# Patient Record
Sex: Male | Born: 1950 | Race: White | Hispanic: No | Marital: Married | Smoking: Current every day smoker
Health system: Southern US, Community
[De-identification: ages and names within clinical notes are randomized; demographics above are authoritative.]

## PROBLEM LIST (undated history)

## (undated) DIAGNOSIS — E785 Hyperlipidemia, unspecified: Secondary | ICD-10-CM

## (undated) DIAGNOSIS — I4891 Unspecified atrial fibrillation: Secondary | ICD-10-CM

## (undated) DIAGNOSIS — F0280 Dementia in other diseases classified elsewhere without behavioral disturbance: Secondary | ICD-10-CM

## (undated) DIAGNOSIS — G4733 Obstructive sleep apnea (adult) (pediatric): Secondary | ICD-10-CM

## (undated) DIAGNOSIS — Z7901 Long term (current) use of anticoagulants: Secondary | ICD-10-CM

## (undated) DIAGNOSIS — R000 Tachycardia, unspecified: Secondary | ICD-10-CM

## (undated) HISTORY — PX: UMBILICAL HERNIA REPAIR: SHX196

## (undated) HISTORY — DX: Obstructive sleep apnea (adult) (pediatric): G47.33

## (undated) HISTORY — PX: ORBITAL FRACTURE SURGERY: SHX725

---

## 2003-04-10 ENCOUNTER — Ambulatory Visit (HOSPITAL_COMMUNITY): Admission: RE | Admit: 2003-04-10 | Discharge: 2003-04-10 | Payer: Self-pay | Admitting: Cardiovascular Disease

## 2006-06-06 ENCOUNTER — Ambulatory Visit (HOSPITAL_COMMUNITY): Admission: RE | Admit: 2006-06-06 | Discharge: 2006-06-06 | Payer: Self-pay | Admitting: Gastroenterology

## 2009-01-22 ENCOUNTER — Encounter: Admission: RE | Admit: 2009-01-22 | Discharge: 2009-01-22 | Payer: Self-pay | Admitting: General Surgery

## 2009-01-23 ENCOUNTER — Ambulatory Visit (HOSPITAL_BASED_OUTPATIENT_CLINIC_OR_DEPARTMENT_OTHER): Admission: RE | Admit: 2009-01-23 | Discharge: 2009-01-23 | Payer: Self-pay | Admitting: General Surgery

## 2010-03-13 ENCOUNTER — Encounter: Payer: Self-pay | Admitting: Cardiovascular Disease

## 2010-05-25 LAB — CBC
HCT: 43 % (ref 39.0–52.0)
Platelets: 214 10*3/uL (ref 150–400)
RDW: 14.9 % (ref 11.5–15.5)
WBC: 4.3 10*3/uL (ref 4.0–10.5)

## 2010-05-25 LAB — DIFFERENTIAL
Basophils Absolute: 0 10*3/uL (ref 0.0–0.1)
Eosinophils Relative: 2 % (ref 0–5)
Lymphocytes Relative: 25 % (ref 12–46)
Neutro Abs: 2.7 10*3/uL (ref 1.7–7.7)
Neutrophils Relative %: 64 % (ref 43–77)

## 2010-05-25 LAB — BASIC METABOLIC PANEL
BUN: 14 mg/dL (ref 6–23)
Calcium: 9.3 mg/dL (ref 8.4–10.5)
Creatinine, Ser: 1.02 mg/dL (ref 0.4–1.5)
GFR calc non Af Amer: >60 mL/min (ref >60)
Glucose, Bld: 114 mg/dL — ABNORMAL HIGH (ref 70–99)

## 2010-07-09 NOTE — Op Note (Signed)
NAME:  Bradley Mcbride, Bradley Mcbride             ACCOUNT NO.:  [id number]   MEDICAL RECORD NO.:  [id number]          PATIENT TYPE:  AMB   LOCATION:  ENDO                         FACILITY:  [facility]   PHYSICIAN:  Shirley Friar, MDDATE OF BIRTH:  1950-12-04   DATE OF PROCEDURE:  06/06/2006  DATE OF DISCHARGE:                               OPERATIVE REPORT   PROCEDURE:  Colonoscopy.   INDICATION:  Heme-positive stool.   MEDICATIONS:  Fentanyl 100 mcg IV, Versed 10 mg IV.   FINDINGS:  Rectal exam was normal.  The adult Pentax colonoscope was  inserted into a fair prepped colon and advanced to the cecum where the  ileocecal valve and appendiceal orifice were identified.  Careful  withdrawal of the colonoscope revealed no polyps or lesions.  He had  diffuse sigmoid diverticulosis, both large and small size.  Retroflexion  showed small internal hemorrhoids.   ASSESSMENT:  1. Sigmoid diverticulosis.  2. Small internal hemorrhoids.   PLAN:  Check CBC, if the patient is anemic, then may need to do upper  endoscopy.      Shirley Friar, MD  Electronically Signed     VCS/MEDQ  D:  06/06/2006  T:  06/06/2006  Job:  [id number]   cc:   Bryan Lemma. Manus Gunning, M.D.

## 2015-08-07 DIAGNOSIS — H524 Presbyopia: Secondary | ICD-10-CM | POA: Diagnosis not present

## 2015-08-07 DIAGNOSIS — H5213 Myopia, bilateral: Secondary | ICD-10-CM | POA: Diagnosis not present

## 2015-08-07 DIAGNOSIS — H40012 Open angle with borderline findings, low risk, left eye: Secondary | ICD-10-CM | POA: Diagnosis not present

## 2015-08-07 DIAGNOSIS — H40011 Open angle with borderline findings, low risk, right eye: Secondary | ICD-10-CM | POA: Diagnosis not present

## 2015-08-10 DIAGNOSIS — L989 Disorder of the skin and subcutaneous tissue, unspecified: Secondary | ICD-10-CM | POA: Diagnosis not present

## 2015-08-10 DIAGNOSIS — C44329 Squamous cell carcinoma of skin of other parts of face: Secondary | ICD-10-CM | POA: Diagnosis not present

## 2015-08-11 DIAGNOSIS — I10 Essential (primary) hypertension: Secondary | ICD-10-CM | POA: Diagnosis not present

## 2015-08-11 DIAGNOSIS — Z7982 Long term (current) use of aspirin: Secondary | ICD-10-CM | POA: Diagnosis not present

## 2015-08-11 DIAGNOSIS — Z87891 Personal history of nicotine dependence: Secondary | ICD-10-CM | POA: Diagnosis not present

## 2015-08-11 DIAGNOSIS — C4492 Squamous cell carcinoma of skin, unspecified: Secondary | ICD-10-CM

## 2015-08-11 DIAGNOSIS — Z79899 Other long term (current) drug therapy: Secondary | ICD-10-CM | POA: Diagnosis not present

## 2015-08-11 DIAGNOSIS — C44329 Squamous cell carcinoma of skin of other parts of face: Secondary | ICD-10-CM | POA: Diagnosis not present

## 2015-08-11 HISTORY — DX: Squamous cell carcinoma of skin, unspecified: C44.92

## 2015-09-07 DIAGNOSIS — C44329 Squamous cell carcinoma of skin of other parts of face: Secondary | ICD-10-CM | POA: Diagnosis not present

## 2015-09-08 DIAGNOSIS — Z85828 Personal history of other malignant neoplasm of skin: Secondary | ICD-10-CM | POA: Diagnosis not present

## 2015-09-08 DIAGNOSIS — L905 Scar conditions and fibrosis of skin: Secondary | ICD-10-CM | POA: Diagnosis not present

## 2015-10-16 DIAGNOSIS — L821 Other seborrheic keratosis: Secondary | ICD-10-CM | POA: Diagnosis not present

## 2015-10-16 DIAGNOSIS — Z85828 Personal history of other malignant neoplasm of skin: Secondary | ICD-10-CM | POA: Diagnosis not present

## 2015-10-16 DIAGNOSIS — L309 Dermatitis, unspecified: Secondary | ICD-10-CM | POA: Diagnosis not present

## 2015-10-16 DIAGNOSIS — L570 Actinic keratosis: Secondary | ICD-10-CM | POA: Diagnosis not present

## 2015-10-16 DIAGNOSIS — D225 Melanocytic nevi of trunk: Secondary | ICD-10-CM | POA: Diagnosis not present

## 2015-10-16 DIAGNOSIS — D485 Neoplasm of uncertain behavior of skin: Secondary | ICD-10-CM | POA: Diagnosis not present

## 2015-10-16 DIAGNOSIS — B351 Tinea unguium: Secondary | ICD-10-CM | POA: Diagnosis not present

## 2015-10-19 DIAGNOSIS — L309 Dermatitis, unspecified: Secondary | ICD-10-CM | POA: Diagnosis not present

## 2015-10-19 DIAGNOSIS — L719 Rosacea, unspecified: Secondary | ICD-10-CM | POA: Diagnosis not present

## 2015-10-19 DIAGNOSIS — L280 Lichen simplex chronicus: Secondary | ICD-10-CM | POA: Diagnosis not present

## 2015-10-28 DIAGNOSIS — Z125 Encounter for screening for malignant neoplasm of prostate: Secondary | ICD-10-CM | POA: Diagnosis not present

## 2015-10-28 DIAGNOSIS — Z0000 Encounter for general adult medical examination without abnormal findings: Secondary | ICD-10-CM | POA: Diagnosis not present

## 2015-10-28 DIAGNOSIS — N529 Male erectile dysfunction, unspecified: Secondary | ICD-10-CM | POA: Diagnosis not present

## 2015-10-28 DIAGNOSIS — E7800 Pure hypercholesterolemia, unspecified: Secondary | ICD-10-CM | POA: Diagnosis not present

## 2015-10-28 DIAGNOSIS — I10 Essential (primary) hypertension: Secondary | ICD-10-CM | POA: Diagnosis not present

## 2015-10-28 DIAGNOSIS — Z23 Encounter for immunization: Secondary | ICD-10-CM | POA: Diagnosis not present

## 2015-10-28 DIAGNOSIS — Z1211 Encounter for screening for malignant neoplasm of colon: Secondary | ICD-10-CM | POA: Diagnosis not present

## 2016-04-05 DIAGNOSIS — H5213 Myopia, bilateral: Secondary | ICD-10-CM | POA: Diagnosis not present

## 2016-12-14 DIAGNOSIS — Z23 Encounter for immunization: Secondary | ICD-10-CM | POA: Diagnosis not present

## 2016-12-14 DIAGNOSIS — N529 Male erectile dysfunction, unspecified: Secondary | ICD-10-CM | POA: Diagnosis not present

## 2016-12-14 DIAGNOSIS — I10 Essential (primary) hypertension: Secondary | ICD-10-CM | POA: Diagnosis not present

## 2016-12-14 DIAGNOSIS — Z1211 Encounter for screening for malignant neoplasm of colon: Secondary | ICD-10-CM | POA: Diagnosis not present

## 2016-12-14 DIAGNOSIS — Z0000 Encounter for general adult medical examination without abnormal findings: Secondary | ICD-10-CM | POA: Diagnosis not present

## 2016-12-14 DIAGNOSIS — E7800 Pure hypercholesterolemia, unspecified: Secondary | ICD-10-CM | POA: Diagnosis not present

## 2016-12-14 DIAGNOSIS — Z125 Encounter for screening for malignant neoplasm of prostate: Secondary | ICD-10-CM | POA: Diagnosis not present

## 2017-04-05 DIAGNOSIS — H5213 Myopia, bilateral: Secondary | ICD-10-CM | POA: Diagnosis not present

## 2017-08-04 DIAGNOSIS — Z1211 Encounter for screening for malignant neoplasm of colon: Secondary | ICD-10-CM | POA: Diagnosis not present

## 2017-10-05 ENCOUNTER — Encounter (HOSPITAL_COMMUNITY): Payer: Self-pay | Admitting: Emergency Medicine

## 2017-10-05 ENCOUNTER — Emergency Department (HOSPITAL_COMMUNITY): Payer: 59

## 2017-10-05 ENCOUNTER — Inpatient Hospital Stay (HOSPITAL_COMMUNITY)
Admission: EM | Admit: 2017-10-05 | Discharge: 2017-10-06 | DRG: 310 | Disposition: A | Discharge status: Home / Self Care | Payer: 59 | Attending: Internal Medicine | Admitting: Internal Medicine

## 2017-10-05 DIAGNOSIS — I361 Nonrheumatic tricuspid (valve) insufficiency: Secondary | ICD-10-CM | POA: Diagnosis not present

## 2017-10-05 DIAGNOSIS — R5383 Other fatigue: Secondary | ICD-10-CM | POA: Diagnosis not present

## 2017-10-05 DIAGNOSIS — R079 Chest pain, unspecified: Secondary | ICD-10-CM | POA: Diagnosis not present

## 2017-10-05 DIAGNOSIS — Q211 Atrial septal defect: Secondary | ICD-10-CM | POA: Diagnosis not present

## 2017-10-05 DIAGNOSIS — R0602 Shortness of breath: Secondary | ICD-10-CM | POA: Diagnosis not present

## 2017-10-05 DIAGNOSIS — I484 Atypical atrial flutter: Principal | ICD-10-CM | POA: Diagnosis present

## 2017-10-05 DIAGNOSIS — I4902 Ventricular flutter: Secondary | ICD-10-CM | POA: Diagnosis not present

## 2017-10-05 DIAGNOSIS — I10 Essential (primary) hypertension: Secondary | ICD-10-CM | POA: Diagnosis present

## 2017-10-05 DIAGNOSIS — I4892 Unspecified atrial flutter: Secondary | ICD-10-CM | POA: Diagnosis not present

## 2017-10-05 DIAGNOSIS — I700 Atherosclerosis of aorta: Secondary | ICD-10-CM | POA: Diagnosis not present

## 2017-10-05 DIAGNOSIS — F17210 Nicotine dependence, cigarettes, uncomplicated: Secondary | ICD-10-CM | POA: Diagnosis present

## 2017-10-05 DIAGNOSIS — E785 Hyperlipidemia, unspecified: Secondary | ICD-10-CM | POA: Diagnosis present

## 2017-10-05 DIAGNOSIS — I4891 Unspecified atrial fibrillation: Secondary | ICD-10-CM | POA: Diagnosis present

## 2017-10-05 DIAGNOSIS — R0789 Other chest pain: Secondary | ICD-10-CM | POA: Diagnosis not present

## 2017-10-05 HISTORY — DX: Tachycardia, unspecified: R00.0

## 2017-10-05 LAB — TSH: TSH: 2.269 u[IU]/mL (ref 0.350–4.500)

## 2017-10-05 LAB — I-STAT TROPONIN, ED
Troponin i, poc: 0.01 ng/mL (ref 0.00–0.08)
Troponin i, poc: 0.01 ng/mL (ref 0.00–0.08)

## 2017-10-05 LAB — BASIC METABOLIC PANEL
Anion gap: 10 (ref 5–15)
BUN: 17 mg/dL (ref 8–23)
CHLORIDE: 107 mmol/L (ref 98–111)
CO2: 23 mmol/L (ref 22–32)
Calcium: 10 mg/dL (ref 8.9–10.3)
Creatinine, Ser: 1.33 mg/dL — ABNORMAL HIGH (ref 0.61–1.24)
GFR calc Af Amer: >60 mL/min (ref >60)
GFR calc non Af Amer: 54 mL/min — ABNORMAL LOW (ref >60)
GLUCOSE: 127 mg/dL — AB (ref 70–99)
POTASSIUM: 4.7 mmol/L (ref 3.5–5.1)
Sodium: 140 mmol/L (ref 135–145)

## 2017-10-05 LAB — CBC
HEMATOCRIT: 47 % (ref 39.0–52.0)
Hemoglobin: 15.4 g/dL (ref 13.0–17.0)
MCH: 32.1 pg (ref 26.0–34.0)
MCHC: 32.8 g/dL (ref 30.0–36.0)
MCV: 97.9 fL (ref 78.0–100.0)
Platelets: 263 10*3/uL (ref 150–400)
RBC: 4.8 MIL/uL (ref 4.22–5.81)
RDW: 12.5 % (ref 11.5–15.5)
WBC: 9.6 10*3/uL (ref 4.0–10.5)

## 2017-10-05 LAB — MAGNESIUM: Magnesium: 2 mg/dL (ref 1.7–2.4)

## 2017-10-05 MED ORDER — SODIUM CHLORIDE 0.9 % IV SOLN: 250.0000 mL | INTRAVENOUS | Status: DC

## 2017-10-05 MED ORDER — ONDANSETRON HCL 4 MG/2ML IJ SOLN: 4.0000 mg | Freq: Four times a day (QID) | INTRAMUSCULAR | Status: DC | PRN

## 2017-10-05 MED ORDER — ASPIRIN EC 81 MG PO TBEC: 81.0000 mg | DELAYED_RELEASE_TABLET | Freq: Every day | ORAL | Status: DC

## 2017-10-05 MED ORDER — HYDROCORTISONE 1 % EX CREA
1.0000 "application " | TOPICAL_CREAM | Freq: Three times a day (TID) | CUTANEOUS | Status: DC | PRN
  Filled 2017-10-05: qty 28

## 2017-10-05 MED ORDER — SODIUM CHLORIDE 0.9% FLUSH
3.0000 mL | Freq: Two times a day (BID) | INTRAVENOUS | Status: DC
  Administered 2017-10-05 – 2017-10-06 (×3): 3 mL via INTRAVENOUS

## 2017-10-05 MED ORDER — SODIUM CHLORIDE 0.9 % IV SOLN: INTRAVENOUS | Status: DC

## 2017-10-05 MED ORDER — ACETAMINOPHEN 325 MG PO TABS: 650.0000 mg | ORAL_TABLET | ORAL | Status: DC | PRN

## 2017-10-05 MED ORDER — SODIUM CHLORIDE 0.9% FLUSH: 3.0000 mL | INTRAVENOUS | Status: DC | PRN

## 2017-10-05 MED ORDER — NITROGLYCERIN 0.4 MG SL SUBL: 0.4000 mg | SUBLINGUAL_TABLET | SUBLINGUAL | Status: DC | PRN

## 2017-10-05 MED ORDER — SODIUM CHLORIDE 0.9 % IV BOLUS
1000.0000 mL | Freq: Once | INTRAVENOUS | Status: AC
  Administered 2017-10-05: 1000 mL via INTRAVENOUS

## 2017-10-05 MED ORDER — SODIUM CHLORIDE 0.9 % IV SOLN
INTRAVENOUS | Status: DC
  Administered 2017-10-05: 11:00:00 via INTRAVENOUS

## 2017-10-05 MED ORDER — METOPROLOL SUCCINATE ER 100 MG PO TB24
200.0000 mg | ORAL_TABLET | Freq: Every day | ORAL | Status: DC
  Administered 2017-10-05: 200 mg via ORAL
  Filled 2017-10-05 (×2): qty 1

## 2017-10-05 MED ORDER — ADENOSINE 6 MG/2ML IV SOLN
6.0000 mg | Freq: Once | INTRAVENOUS | Status: AC
  Administered 2017-10-05: 6 mg via INTRAVENOUS
  Filled 2017-10-05: qty 2

## 2017-10-05 MED ORDER — ADENOSINE 6 MG/2ML IV SOLN
12.0000 mg | Freq: Once | INTRAVENOUS | Status: AC
  Administered 2017-10-05: 12 mg via INTRAVENOUS
  Filled 2017-10-05: qty 4

## 2017-10-05 MED ORDER — DIPHENHYDRAMINE HCL 25 MG PO CAPS: 12.5000 mg | ORAL_CAPSULE | Freq: Every evening | ORAL | Status: DC | PRN

## 2017-10-05 MED ORDER — APIXABAN 5 MG PO TABS
5.0000 mg | ORAL_TABLET | Freq: Two times a day (BID) | ORAL | Status: DC
  Administered 2017-10-05 – 2017-10-06 (×3): 5 mg via ORAL
  Filled 2017-10-05 (×3): qty 1

## 2017-10-05 MED ORDER — DIPHENHYDRAMINE HCL 12.5 MG/5ML PO ELIX
12.5000 mg | ORAL_SOLUTION | Freq: Every evening | ORAL | Status: DC | PRN
  Administered 2017-10-05: 12.5 mg via ORAL
  Filled 2017-10-05: qty 10
  Filled 2017-10-05 (×2): qty 5

## 2017-10-05 NOTE — Anesthesia Preprocedure Evaluation (Addendum)
Anesthesia Evaluation  Patient identified by MRN, date of birth, ID band Patient awake    Reviewed: Allergy & Precautions, NPO status , Patient's Chart, lab work & pertinent test results, reviewed documented beta blocker date and time   History of Anesthesia Complications Negative for: history of anesthetic complications  Airway Mallampati: II  TM Distance: >3 FB Neck ROM: Full    Dental no notable dental hx. (+) Dental Advisory Given, Teeth Intact   Pulmonary Current Smoker,    Pulmonary exam normal        Cardiovascular Normal cardiovascular exam+ dysrhythmias Atrial Fibrillation      Neuro/Psych negative neurological ROS     GI/Hepatic negative GI ROS, Neg liver ROS,   Endo/Other  negative endocrine ROS  Renal/GU Renal InsufficiencyRenal disease     Musculoskeletal negative musculoskeletal ROS (+)   Abdominal   Peds  Hematology negative hematology ROS (+)   Anesthesia Other Findings Day of surgery medications reviewed with the patient.  Reproductive/Obstetrics                           Anesthesia Physical Anesthesia Plan  ASA: III  Anesthesia Plan: General   Post-op Pain Management:    Induction: Intravenous  PONV Risk Score and Plan: 1 and Propofol infusion  Airway Management Planned: Mask  Additional Equipment:   Intra-op Plan:   Post-operative Plan:   Informed Consent: I have reviewed the patients History and Physical, chart, labs and discussed the procedure including the risks, benefits and alternatives for the proposed anesthesia with the patient or authorized representative who has indicated his/her understanding and acceptance.   Dental advisory given  Plan Discussed with: CRNA, Anesthesiologist and Surgeon  Anesthesia Plan Comments:        Anesthesia Quick Evaluation

## 2017-10-05 NOTE — H&P (Addendum)
H&P   Patient ID: Bradley Mcbride; [id number]; Feb 21, 1951   Admit date: 10/05/2017 Date of Consult: 10/05/2017  Primary Care Provider: Gaynelle Arabian, MD Primary Cardiologist: No primary care provider on file.  Primary Electrophysiologist:  new   Patient Profile:   Bradley Mcbride is a 67 y.o. male with no prior medical history, though has been chonically on Toprol 200mg  daily chronically for years 2/2 to a higher hen usual resting HR noted several years ago by his PMD, denies any dx of arrhythmias, HTN or any other PMHx.  He is being seen today for the evaluation of an SVT, palpitations at the request of Dr. Marisue Humble.  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  No syncope.  He arrived in what was thought to be an SVT, given 6mg  of adenosine without much response, a dose of 12mg  resulted in a nearly 12 second asystole, and regained with aflutter rate controlled 90's  At rest he has no cardiac awareness, despite being in AFlutter (rate controlled) he is unaware of any palpitations, no symptoms or SOB or CP.  LABS K+ 4.7 BUN/Creat 17/1.33 poc Trip 0.01 x2 WBC 9.6 H/H 15/47 Plts 263  Past Medical History:  Diagnosis Date  . Tachycardia     Past Surgical History:  Procedure Laterality Date  . ORBITAL FRACTURE SURGERY    . UMBILICAL HERNIA REPAIR       Home Medications:  Prior to Admission medications   Not on File    Inpatient Medications: Scheduled Meds: . apixaban  5 mg Oral BID  . [START ON 10/06/2017] aspirin EC  81 mg Oral Daily  . metoprolol succinate  200 mg Oral Daily   Continuous Infusions: . sodium chloride 100 mL/hr at 10/05/17 1033   PRN  Meds:   Allergies:   No Known Allergies  Social History:   Social History   Socioeconomic History  . Marital status: Married    Spouse name: Not on file  . Number of children: Not on file  . Years of education: Not on file  . Highest education level: Not on file  Occupational History  . Not on file  Social Needs  . Financial resource strain: Not on file  . Food insecurity:    Worry: Not on file    Inability: Not on file  . Transportation needs:    Medical: Not on file    Non-medical: Not on file  Tobacco Use  . Smoking status: Light Tobacco Smoker  Substance and Sexual Activity  . Alcohol use: Yes    Comment: occasional  . Drug use: Never  . Sexual activity: Not on file  Lifestyle  . Physical activity:    Days per week: Not on file    Minutes per session: Not on file  . Stress: Not on file  Relationships  . Social connections:    Talks on phone: Not on file    Gets together: Not on file    Attends religious service: Not on file    Active member of club or organization: Not on file    Attends meetings of clubs or organizations: Not on file  Relationship status: Not on file  . Intimate partner violence:    Fear of current or ex partner: Not on file    Emotionally abused: Not on file    Physically abused: Not on file    Forced sexual activity: Not on file  Other Topics Concern  . Not on file  Social History Narrative  . Not on file    Family History:   Family History  Problem Relation Age of Onset  . Heart disease Mother      ROS:  Please see the history of present illness.  All other ROS reviewed and negative.     Physical Exam/Data:   Vitals:   10/05/17 0828 10/05/17 1000  BP: 104/77 113/71  Pulse: 75 (!) 107  Resp: 16 15  Temp: 97.7 F (36.5 C)   TempSrc: Oral   SpO2: 99% 100%   No intake or output data in the 24 hours ending 10/05/17 1146 There were no vitals filed for this visit. There is no height or weight on file to calculate BMI.    General:  Well nourished, well developed, in no acute distress HEENT: normal Lymph: no adenopathy Neck: no JVD Endocrine:  No thryomegaly Vascular: No carotid bruits Cardiac:  iRRR; no murmurs, gallops or rubs Lungs:  CTA b/l, no wheezing, rhonchi or rales  Abd: soft, nontender  Ext: no edema Musculoskeletal:  No deformities Skin: warm and dry  Neuro:  No gross focal abnormalities noted Psych:  Normal affect   EKG:  The EKG was personally reviewed and demonstrates:   Initially AFlutter 2:1 146bpm AFlutter 94bpm, looks atypical Telemetry:  Telemetry was personally reviewed and demonstrates:   Aflutter 90's  Relevant CV Studies:  No historical cardiac data  Laboratory Data:  Chemistry Recent Labs  Lab 10/05/17 0840  NA 140  K 4.7  CL 107  CO2 23  GLUCOSE 127*  BUN 17  CREATININE 1.33*  CALCIUM 10.0  GFRNONAA 54*  GFRAA >60  ANIONGAP 10    No results for input(s): PROT, ALBUMIN, AST, ALT, ALKPHOS, BILITOT in the last 168 hours. Hematology Recent Labs  Lab 10/05/17 0840  WBC 9.6  RBC 4.80  HGB 15.4  HCT 47.0  MCV 97.9  MCH 32.1  MCHC 32.8  RDW 12.5  PLT 263   Cardiac EnzymesNo results for input(s): TROPONINI in the last 168 hours.  Recent Labs  Lab 10/05/17 0847 10/05/17 1016  TROPIPOC 0.01 0.01    BNPNo results for input(s): BNP, PROBNP in the last 168 hours.  DDimer No results for input(s): DDIMER in the last 168 hours.  Radiology/Studies:   Dg Chest 2 View Result Date: 10/05/2017 CLINICAL DATA:  Chest pain sweating EXAM: CHEST - 2 VIEW COMPARISON:  01/22/2009 FINDINGS: The heart size and mediastinal contours are within normal limits. Both lungs are clear. The visualized skeletal structures are unremarkable. IMPRESSION: No active cardiopulmonary disease. Electronically Signed   By: Franchot Gallo M.D.   On: 10/05/2017 09:12    Assessment and Plan:   1. Aflutter     Unclear how long given here at rest with rate control he is asymptomatic      Will continue his home Toprol     Add Eliqus for a/c     Admit, plan for TEE/DCCV tomorrow  CHA2DS2Vasc is one for age, he denies HTN or any known medical history  2. + ETOH use     Reports 2-3 scotch drinks most nights     Counseled on  this     Follow  3. + smoker     1/2ppd, counseled    For questions or updates, please contact [place] Please consult [email or web address] for contact info under Cardiology/STEMI.   Signed, Baldwin Jamaica, PA-C  10/05/2017 11:46 AM  EP attending  Patient seen and examined.  Agree with the findings as noted above.  Patient is a very pleasant 67 year old man with a history of hypertension, who presents today for evaluation of shortness of breath associated with atrial flutter with a rapid ventricular response.  The patient does not feel the initiation of atrial flutter.  He presented to the hospital after experiencing dyspnea with exertion.  He was found to be in atypical atrial flutter with a rapid ventricular response despite treatment with metoprolol.  He was given intravenous adenosine with demonstrated atypical atrial flutter.  He is referred now for additional evaluation.  He has not been on systemic anticoagulation.  He carries a 2-3 scotch history daily.  He also smokes 1/2 pack of cigarettes a day.  His exam and additional history as noted above.  Telemetry demonstrates atrial fibrillation/flutter with a controlled ventricular response.  Initial EKG demonstrates atypical atrial flutter with 2-1 AV conduction.  Labs and chest x-ray been reviewed.  The patient will be admitted to the hospital and started on Eliquis.  His ventricular rate is reasonably well controlled.  He will undergo TEE guided cardioversion tomorrow.  We will hold off on antiarrhythmic therapy unless the patient develops recurrent atrial arrhythmias after cardioversion.  He will need long-term systemic anticoagulation with his age and hypertension.  Cristopher Peru, MD

## 2017-10-05 NOTE — ED Notes (Addendum)
Bradley Mcbride [phone] Please call wife with updates on patient.

## 2017-10-05 NOTE — ED Notes (Signed)
ED Provider at bedside. 

## 2017-10-05 NOTE — ED Provider Notes (Addendum)
[facility] EMERGENCY DEPARTMENT Provider Note   CSN: [id number] Arrival date & time: 10/05/17  0813     History   Chief Complaint Chief Complaint  Patient presents with  . Chest Pain    HPI Bradley Mcbride is a 67 y.o. male.  Patient works at day surgery.  While walking in from the parking lot he got very diaphoretic and having anterior chest discomfort.  Patient denied any shortness of breath or any radiation of the pain.  At work he was given 81 mg of aspirin.  Upon arrival here patient's heart rate was noted to be in the 145 range.  They were able to tell that his heart rate was high at work.  Patient has no history of tachycardia problems.  Patient's past medical history noncontributory.  Upon arrival here heart rate was still in the 140s.  Chest pain was minimal.  No further diaphoresis.     Past Medical History:  Diagnosis Date  . Tachycardia     There are no active problems to display for this patient.   Past Surgical History:  Procedure Laterality Date  . ORBITAL FRACTURE SURGERY    . UMBILICAL HERNIA REPAIR          Home Medications    Prior to Admission medications   Medication Sig Start Date End Date Taking? Authorizing Provider  aspirin 81 MG chewable tablet Chew by mouth daily.   Yes [provider]  ezetimibe-simvastatin (VYTORIN) 10-20 MG tablet Take 1 tablet by mouth daily.   Yes [provider]  glucosamine-chondroitin 500-400 MG tablet Take 1 tablet by mouth 2 (two) times daily.   Yes [provider]  metoprolol (TOPROL-XL) 200 MG 24 hr tablet Take 200 mg by mouth daily.   Yes [provider]  Multiple Vitamin (MULTIVITAMIN) tablet Take 1 tablet by mouth daily.   Yes [provider]  Omega-3 Fatty Acids (FISH OIL) 1000 MG CAPS Take 2,000 mg by mouth 2 (two) times daily.   Yes [provider]    Family History No family history on file.  Social History Social History    Tobacco Use  . Smoking status: Light Tobacco Smoker  Substance Use Topics  . Alcohol use: Yes    Comment: occasional  . Drug use: Never     Allergies   Patient has no known allergies.   Review of Systems Review of Systems  Constitutional: Positive for diaphoresis and fatigue. Negative for fever.  HENT: Negative for congestion.   Respiratory: Negative for shortness of breath.   Cardiovascular: Positive for chest pain. Negative for leg swelling.  Gastrointestinal: Negative for abdominal pain, nausea and vomiting.  Genitourinary: Negative for dysuria.  Musculoskeletal: Negative for back pain.  Skin: Negative for rash.  Neurological: Negative for syncope.  Hematological: Does not bruise/bleed easily.  Psychiatric/Behavioral: Negative for confusion.     Physical Exam Updated Vital Signs BP 113/71   Pulse (!) 107   Temp 97.7 F (36.5 C) (Oral)   Resp 15   SpO2 100%   Physical Exam  Constitutional: He is oriented to person, place, and time. He appears well-developed and well-nourished. No distress.  HENT:  Head: Normocephalic and atraumatic.  Mouth/Throat: Oropharynx is clear and moist.  Eyes: Pupils are equal, round, and reactive to light. Conjunctivae and EOM are normal.  Neck: Normal range of motion. Neck supple.  Cardiovascular: Regular rhythm.  Rapid heart rate regular  Pulmonary/Chest: Effort normal and breath sounds normal. No  respiratory distress.  Abdominal: Soft. Bowel sounds are normal.  Musculoskeletal: Normal range of motion. He exhibits no edema.  Neurological: He is alert and oriented to person, place, and time. No cranial nerve deficit or sensory deficit. He exhibits normal muscle tone. Coordination normal.  Skin: Skin is warm.  Nursing note and vitals reviewed.    ED Treatments / Results  Labs (all labs ordered are listed, but only abnormal results are displayed) Labs Reviewed  BASIC METABOLIC PANEL - Abnormal; Notable for the following  components:      Result Value   Glucose, Bld 127 (*)    Creatinine, Ser 1.33 (*)    GFR calc non Af Amer 54 (*)    All other components within normal limits  CBC  TSH  I-STAT TROPONIN, ED  I-STAT TROPONIN, ED    EKG EKG Interpretation  Date/Time:  Thursday October 05 2017 09:26:21 EDT Ventricular Rate:  146 PR Interval:  154 QRS Duration: 93 QT Interval:  309 QTC Calculation: 482 R Axis:   -51 Text Interpretation:  Sinus tachycardia LAD, consider left anterior fascicular block RSR' in V1 or V2, right VCD or RVH Borderline prolonged QT interval Supraventricular tachycardia Confirmed by Fredia Sorrow [phone]) on 10/05/2017 9:31:43 AM   Radiology Dg Chest 2 View  Result Date: 10/05/2017 CLINICAL DATA:  Chest pain sweating EXAM: CHEST - 2 VIEW COMPARISON:  01/22/2009 FINDINGS: The heart size and mediastinal contours are within normal limits. Both lungs are clear. The visualized skeletal structures are unremarkable. IMPRESSION: No active cardiopulmonary disease. Electronically Signed   By: Franchot Gallo M.D.   On: 10/05/2017 09:12    Procedures Procedures (including critical care time)  CRITICAL CARE Performed by: Fredia Sorrow Total critical care time: 45 minutes Critical care time was exclusive of separately billable procedures and treating other patients. Critical care was necessary to treat or prevent imminent or life-threatening deterioration. Critical care was time spent personally by me on the following activities: development of treatment plan with patient and/or surrogate as well as nursing, discussions with consultants, evaluation of patient's response to treatment, examination of patient, obtaining history from patient or surrogate, ordering and performing treatments and interventions, ordering and review of laboratory studies, ordering and review of radiographic studies, pulse oximetry and re-evaluation of patient's condition.   Medications Ordered in  ED Medications  0.9 %  sodium chloride infusion ( Intravenous New Bag/Given 10/05/17 1033)  apixaban (ELIQUIS) tablet 5 mg (has no administration in time range)  adenosine (ADENOCARD) 6 MG/2ML injection 6 mg (6 mg Intravenous Given 10/05/17 0942)  adenosine (ADENOCARD) 6 MG/2ML injection 12 mg (12 mg Intravenous Given 10/05/17 0944)  sodium chloride 0.9 % bolus 1,000 mL (1,000 mLs Intravenous New Bag/Given 10/05/17 1004)     Initial Impression / Assessment and Plan / ED Course  I have reviewed the triage vital signs and the nursing notes.  Pertinent labs & imaging results that were available during my care of the patient were reviewed by me and considered in my medical decision making (see chart for details).    Patient stable upon presentation except for the rapid heart rate in the 140s upper 140s on monitor and by EKG suggestive of perhaps an SVT.  No distinct P waves.  Based on this patient received adenosine 6 mg IV that a brief pause and then patient went right back into the heart rate in the 140s.  Because the pause was brief we opted to go ahead and give 12 mg.  With that dose patient had a appropriate pause and there were clearly development of what looked like flutter waves but the QRS complexes took several seconds to come back.  In the meantime patient had a syncopal type episode.  QRS complexes did come back.  The patient went right back into the rapid heart rate of 145.  Patient then somewhat converted and there was evidence of flutter waves like 3-1 flutter.  Then at times he was also in normal sinus.  Heart rate stayed in the 90s to low 100s.  Did not have the rapid atrial flutter.  So after doing the adenosine it was clear that there was probably an atrial flutter rhythm.  But the atrial wave morphology was unusual.  In discussion with cardiology said it probably represents that the atrial flutter was generated from the left atrium and not where it usually is in the right  atrium.  Patient did have some increased pain after the long pause.  Initial troponins were negative labs were normal and repeat troponin also negative.  Cardiology contacted for the new atrial flutter and plus he also had chest pain prior and some chest pain after the adenosine.  The patient will be admitted by cardiology.  Patient was being monitored when the adenosine was given patient also was on 2 L of oxygen.   Cardiac crash cart was just outside the door.  Following the long pauses in the syncopal event patient received a liter of fluid.  Chest x-ray earlier was known and was normal.   Final Clinical Impressions(s) / ED Diagnoses   Final diagnoses:  Atrial flutter with rapid ventricular response [facility])    ED Discharge Orders    None       Fredia Sorrow, MD 10/05/17 1218    Fredia Sorrow, MD 10/05/17 1220

## 2017-10-05 NOTE — ED Triage Notes (Signed)
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 Pt was given 81mg  asa. Denies pain radiation or sob.

## 2017-10-06 ENCOUNTER — Inpatient Hospital Stay (HOSPITAL_COMMUNITY): Payer: 59 | Admitting: Anesthesiology

## 2017-10-06 ENCOUNTER — Encounter (HOSPITAL_COMMUNITY)
Admission: EM | Disposition: A | Discharge status: Home / Self Care | Payer: Self-pay | Source: Home / Self Care | Attending: Internal Medicine

## 2017-10-06 ENCOUNTER — Inpatient Hospital Stay (HOSPITAL_COMMUNITY): Payer: 59

## 2017-10-06 ENCOUNTER — Encounter (HOSPITAL_COMMUNITY): Payer: Self-pay | Admitting: Certified Registered Nurse Anesthetist

## 2017-10-06 DIAGNOSIS — Q211 Atrial septal defect: Secondary | ICD-10-CM

## 2017-10-06 DIAGNOSIS — I4891 Unspecified atrial fibrillation: Secondary | ICD-10-CM

## 2017-10-06 HISTORY — PX: CARDIOVERSION: SHX1299

## 2017-10-06 HISTORY — PX: TEE WITHOUT CARDIOVERSION: SHX5443

## 2017-10-06 SURGERY — ECHOCARDIOGRAM, TRANSESOPHAGEAL
Anesthesia: General

## 2017-10-06 MED ORDER — PROPOFOL 10 MG/ML IV BOLUS
INTRAVENOUS | Status: DC | PRN
  Administered 2017-10-06 (×3): 10 mg via INTRAVENOUS

## 2017-10-06 MED ORDER — APIXABAN 5 MG PO TABS: 5.0000 mg | ORAL_TABLET | Freq: Two times a day (BID) | ORAL | 6 refills | Status: DC

## 2017-10-06 MED ORDER — PROPOFOL 500 MG/50ML IV EMUL
INTRAVENOUS | Status: DC | PRN
  Administered 2017-10-06: 100 ug/kg/min via INTRAVENOUS

## 2017-10-06 MED ORDER — SODIUM CHLORIDE 0.9 % IV SOLN
INTRAVENOUS | Status: DC | PRN
  Administered 2017-10-06: 08:00:00 via INTRAVENOUS

## 2017-10-06 MED ORDER — PHENYLEPHRINE 40 MCG/ML (10ML) SYRINGE FOR IV PUSH (FOR BLOOD PRESSURE SUPPORT)
PREFILLED_SYRINGE | INTRAVENOUS | Status: DC | PRN
  Administered 2017-10-06: 80 ug via INTRAVENOUS
  Administered 2017-10-06: 40 ug via INTRAVENOUS

## 2017-10-06 NOTE — Transfer of Care (Signed)
Immediate Anesthesia Transfer of Care Note  Patient: Bradley Mcbride  Procedure(s) Performed: TRANSESOPHAGEAL ECHOCARDIOGRAM (TEE) (N/A ) CARDIOVERSION (N/A )  Patient Location: Endoscopy Unit  Anesthesia Type:MAC  Level of Consciousness: awake, alert  and oriented  Airway & Oxygen Therapy: Patient Spontanous Breathing and Patient connected to nasal cannula oxygen  Post-op Assessment: Report given to RN, Post -op Vital signs reviewed and stable and Patient moving all extremities X 4  Post vital signs: Reviewed and stable  Last Vitals:  Vitals Value Taken Time  BP 92/70 10/06/2017  8:49 AM  Temp    Pulse 79 10/06/2017  8:50 AM  Resp 15 10/06/2017  8:50 AM  SpO2 100 % 10/06/2017  8:50 AM  Vitals shown include unvalidated device data.  Last Pain:  Vitals:   10/06/17 0711  TempSrc: Oral  PainSc: 0-No pain      Patients Stated Pain Goal: 0 (78/67/54 4920)  Complications: No apparent anesthesia complications

## 2017-10-06 NOTE — CV Procedure (Signed)
TEE  Patient anesthetized by anesthesia with propofol TEE probe advanced to mid esophagus without difficulty  LA, LA appendage without masses PFO present by color doppler and with injection of agitated saline MV normal   No MR AV normal   No AI TV normal   Trace TR PV normal LVEF and RVEF normal Minimal plaque noted in thoracic aorta

## 2017-10-06 NOTE — Interval H&P Note (Signed)
History and Physical Interval Note:  10/06/2017 8:04 AM  Bradley Mcbride  has presented today for surgery, with the diagnosis of atrial fibrillation  The various methods of treatment have been discussed with the patient and family. After consideration of risks, benefits and other options for treatment, the patient has consented to  Procedure(s): TRANSESOPHAGEAL ECHOCARDIOGRAM (TEE) (N/A) CARDIOVERSION (N/A) as a surgical intervention .  The patient's history has been reviewed, patient examined, no change in status, stable for surgery.  I have reviewed the patient's chart and labs.  Questions were answered to the patient's satisfaction.     [name]

## 2017-10-06 NOTE — CV Procedure (Signed)
Cardioversion  Patient anesthetized by anesthesia with propofol  With pads in AP position, patient cardioverted to SR with 200 J synchronized biphasic energy 12 lead EKG pending   Procedure without complication.  Bradley Mcbride

## 2017-10-06 NOTE — Anesthesia Postprocedure Evaluation (Addendum)
Anesthesia Post Note  Patient: Bradley Mcbride  Procedure(s) Performed: TRANSESOPHAGEAL ECHOCARDIOGRAM (TEE) (N/A ) CARDIOVERSION (N/A )     Patient location during evaluation: Endoscopy Anesthesia Type: MAC Level of consciousness: awake and alert Pain management: pain level controlled Vital Signs Assessment: post-procedure vital signs reviewed and stable Respiratory status: spontaneous breathing and respiratory function stable Cardiovascular status: stable Postop Assessment: no apparent nausea or vomiting Anesthetic complications: no    Last Vitals:  Vitals:   10/06/17 0900 10/06/17 0905  BP: (!) 90/57 92/61  Pulse: 81 82  Resp: 16 (!) 21  Temp:    SpO2: 100% 98%    Last Pain:  Vitals:   10/06/17 0847  TempSrc: Oral  PainSc: 0-No pain                 Jazelle Achey DANIEL

## 2017-10-06 NOTE — Anesthesia Procedure Notes (Signed)
Procedure Name: MAC Date/Time: 10/06/2017 8:15 AM Performed by: Harden Mo, CRNA Pre-anesthesia Checklist: Patient identified, Emergency Drugs available, Suction available, Patient being monitored and Timeout performed Patient Re-evaluated:Patient Re-evaluated prior to induction Oxygen Delivery Method: Nasal cannula Preoxygenation: Pre-oxygenation with 100% oxygen Induction Type: IV induction Placement Confirmation: positive ETCO2 and breath sounds checked- equal and bilateral Dental Injury: Teeth and Oropharynx as per pre-operative assessment

## 2017-10-06 NOTE — Addendum Note (Signed)
Addendum  created 10/06/17 0934 by Duane Boston, MD   Sign clinical note

## 2017-10-06 NOTE — Discharge Summary (Addendum)
ELECTROPHYSIOLOGY PROCEDURE DISCHARGE SUMMARY    Patient ID: Bradley Mcbride,  MRN: [id number], DOB/AGE: January 24, 1951 67 y.o.  Admit date: 10/05/2017 Discharge date: 10/06/2017  Primary Care Physician: Gaynelle Arabian, MD  Primary Cardiologist/Electrophysiologist: new to [facility], [name]  Primary Discharge Diagnosis:  1. Atypical atrial flutter  Secondary Discharge Diagnosis:  None  No Known Allergies   Procedures This Admission:  1. 10/06/17 TEE LA, LA appendage without masses PFO present by color doppler and with injection of agitated saline MV normal   No MR AV normal   No AI TV normal   Trace TR PV normal LVEF and RVEF normal Minimal plaque noted in thoracic aorta  2. 10/06/17 DCCV Patient cardioverted to SR with 200 J synchronized biphasic energy Procedure without complication.  Brief HPI: Bradley Mcbride is a 67 y.o. male with no prior medical history, though has been chonically on Toprol 200mg  daily chronically for years 2/2 to a higher hen usual resting HR noted several years ago by his PMD sought medical attention for fast HR, acute onset weakness, no syncope.        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  No syncope.  He arrived in what was thought to be an SVT, given 6mg  of adenosine without much response, a dose of 12mg  resulted in a nearly 12 second asystole, and regained with aflutter rate controlled 90's  At rest he has no cardiac awareness, despite being in AFlutter (rate controlled) he is unaware of any palpitations, no symptoms or SOB or CP.  Ultimately felt could not for certain know given no rest symptoms with his arrhythmia, how long  he had been in AFlutter, started on Eliquis and planned for TEE/DCCV.  Labs were unremarkable, POC trop neg x2.    The patient was examined by [name] this morning and considered stable for discharge to home post TEE/DCCV. He underwent TEE/DCCV this morning as noted above.  His CHA2DS2Vasc score is one for age, he has been longstanding for years on high dose Toprol for elevated resting HR, denies any HTN, or any other known medical hx (though is on tx for HLD).  Will continue his home BB and add Eliquis to his regime, stop his home ASA.  He has been counseled on smoking/ETOH, was receptive.  Follow up with the Afib clinic and Dr.Dayton Sherr has been arranged.  Physical Exam: Vitals:   10/06/17 0900 10/06/17 0905 10/06/17 0910 10/06/17 0943  BP: (!) 90/57  92/61 116/70  Pulse: 81 82 80   Resp: 16 (!) 21 13 15   Temp:    97.7 F (36.5 C)  TempSrc:    Oral  SpO2: 100% 98% 99% 100%  Weight:        GEN- The patient is well appearing, alert and oriented x 3 today.   HEENT: normocephalic, atraumatic; sclera clear, conjunctiva pink; hearing intact; oropharynx clear; neck supple, no JVP Lungs- CTA b/l, normal work of breathing.  No wheezes, rales, rhonchi Heart- RRR, no murmurs, rubs or gallops, PMI not laterally displaced GI- soft, non-tender, non-distended Extremities- no clubbing, cyanosis, or edema MS- no significant deformity or atrophy Skin- warm and dry,  no rash or lesion Psych- euthymic mood, full affect Neuro- no gross deficits   Labs:   Lab Results  Component Value Date   WBC 9.6 10/05/2017   HGB 15.4 10/05/2017   HCT 47.0 10/05/2017   MCV 97.9 10/05/2017   PLT 263 10/05/2017    Recent Labs  Lab 10/05/17 0840  NA 140  K 4.7  CL 107  CO2 23  BUN 17  CREATININE 1.33*  CALCIUM 10.0  GLUCOSE 127*    Discharge Medications:  Allergies as of 10/06/2017   No Known Allergies     Medication List    STOP taking these medications   aspirin 81 MG chewable tablet       TAKE these medications   apixaban 5 MG Tabs tablet Commonly known as:  ELIQUIS Take 1 tablet (5 mg total) by mouth 2 (two) times daily.   ezetimibe-simvastatin 10-20 MG tablet Commonly known as:  VYTORIN Take 1 tablet by mouth daily.   Fish Oil 1000 MG Caps Take 2,000 mg by mouth 2 (two) times daily.   glucosamine-chondroitin 500-400 MG tablet Take 1 tablet by mouth 2 (two) times daily.   metoprolol 200 MG 24 hr tablet Commonly known as:  TOPROL-XL Take 200 mg by mouth daily.   multivitamin tablet Take 1 tablet by mouth daily.       Disposition: Home  Discharge Instructions    Diet - low sodium heart healthy   Complete by:  As directed    Increase activity slowly   Complete by:  As directed      Follow-up Information    [facility] Follow up on 10/27/2017.   Specialty:  Cardiology Why:  9:00AM Contact information: [address] [id number] mc [place]       Evans Lance, MD Follow up on 12/13/2017.   Specialty:  Cardiology Why:  9:00AM Contact information: [address] [address] [phone]           Duration of Discharge Encounter: Greater than 30 minutes including physician time.  Venetia Night, PA-C 10/06/2017 10:08 AM  EP Attending  Patient seen and examined. Agree with above. The patient is s/p TEE guided DCCV and maintains NSR. He will be discharged with usual followup.   Mikle Bosworth.D.

## 2017-10-06 NOTE — Care Management (Signed)
10-06-17  BENEFITS CHECK :  # 1.   S/W JOHN  @ MED [facility] RX # [phone]   ELIQUIS   50 MG BID COVER- YES CO-PAY- $ 82.75 TIER- 3 DRUG PRIOR APPROVAL- NO NO DEDUCTIBLE  PREFERRED PHARMACY : YES [facility]  PRESCRIPTION HAS BEEN APPROVED AT [facility]       ALSO S/W [facility]  ( coupon )

## 2017-10-06 NOTE — Care Management (Signed)
1137 10-06-17 Benefits Check completed for Eliquis. Patient is aware of cost and he was provided his 30 day free card and $10.00 co pay card. No further needs from CM at this time. Bethena Roys, RN,BSN [phone]

## 2017-10-08 ENCOUNTER — Encounter (HOSPITAL_COMMUNITY): Payer: Self-pay | Admitting: Internal Medicine

## 2017-10-09 ENCOUNTER — Other Ambulatory Visit: Payer: Self-pay | Admitting: *Deleted

## 2017-10-09 ENCOUNTER — Encounter: Payer: Self-pay | Admitting: *Deleted

## 2017-10-09 NOTE — Patient Outreach (Signed)
[facility]) Care Management  10/09/2017  XXAVIER NOON March 07, 1950 [id number]   Subjective: Telephone call to patient's home / mobile number, spoke with patient, and HIPAA verified.  Discussed [facility] Care Management [facility] Transition of care  follow up, patient voiced understanding, and is in agreement to follow up.   Patient states he is doing well, currently at work [employer]), is back to his baseline prior to hospitalization, and has a follow up appointment with cardiologist on  10/26/17.  Patient states he is able to manage self care and has assistance as needed.   Patient voices understanding of medical diagnosis and treatment plan.  States he is accessing the following [facility] benefits: outpatient pharmacy, hospital indemnity (not chosen), and family medical leave act (FMLA) not needed at this time. Patient states he does not have any education material, transition of care, care coordination, disease management, disease monitoring, transportation, community resource, or pharmacy needs at this time.  States he is very appreciative of the follow up and is in agreement to receive [facility] Management information.      Objective:  Per KPN (Knowledge Performance Now, point of care tool) and chart review, patient hospitalized 10/05/17 -10/06/17 for Atypical atrial flutter with cardioversion.       Assessment: Received [facility] Transition of care referral on 10/09/17.  Transition of care follow up completed, no care management needs, and will proceed with case closure.      Plan: RNCM will send patient successful outreach letter, [facility] pamphlet, and magnet. RNCM will complete case closure due to follow up completed / no care management needs.         Jocsan Mcginley H. Annia Friendly, BSN, [facility] Management [facility] Telephonic CM Phone: [phone] Fax: [phone]

## 2017-10-26 ENCOUNTER — Ambulatory Visit (HOSPITAL_COMMUNITY)
Admission: RE | Admit: 2017-10-26 | Discharge: 2017-10-26 | Disposition: A | Discharge status: Home / Self Care | Payer: 59 | Source: Ambulatory Visit | Attending: Nurse Practitioner | Admitting: Nurse Practitioner

## 2017-10-26 ENCOUNTER — Encounter (HOSPITAL_COMMUNITY): Payer: Self-pay | Admitting: Nurse Practitioner

## 2017-10-26 VITALS — BP 118/74 | HR 77 | Ht 70.0 in | Wt 169.0 lb

## 2017-10-26 DIAGNOSIS — I484 Atypical atrial flutter: Secondary | ICD-10-CM | POA: Insufficient documentation

## 2017-10-26 DIAGNOSIS — Z79899 Other long term (current) drug therapy: Secondary | ICD-10-CM | POA: Diagnosis not present

## 2017-10-26 DIAGNOSIS — Z7901 Long term (current) use of anticoagulants: Secondary | ICD-10-CM | POA: Insufficient documentation

## 2017-10-26 DIAGNOSIS — F17210 Nicotine dependence, cigarettes, uncomplicated: Secondary | ICD-10-CM | POA: Insufficient documentation

## 2017-10-26 NOTE — Progress Notes (Signed)
Primary Care Physician: Gaynelle Arabian, MD Referring Physician: Dr.Taylor    Bradley Mcbride is a 67 y.o. male with a h/o SVT, previously on high dose BB, that presented to [facility] ER for atypical atrial flutter with RVR. He had successful cardioversion. He has CHA2DS2VASc of 1(age) and will take apixsban 5 mg bid x one month per cardioversion protocol. He has not had any recurrent issues.  Today, he denies symptoms of palpitations, chest pain, shortness of breath, orthopnea, PND, lower extremity edema, dizziness, presyncope, syncope, or neurologic sequela. The patient is tolerating medications without difficulties and is otherwise without complaint today.   Past Medical History:  Diagnosis Date  . Tachycardia    Past Surgical History:  Procedure Laterality Date  . CARDIOVERSION N/A 10/06/2017   Procedure: CARDIOVERSION;  Surgeon: Fay Records, MD;  Location: [facility];  Service: Cardiovascular;  Laterality: N/A;  . ORBITAL FRACTURE SURGERY    . TEE WITHOUT CARDIOVERSION N/A 10/06/2017   Procedure: TRANSESOPHAGEAL ECHOCARDIOGRAM (TEE);  Surgeon: Fay Records, MD;  Location: [facility] ENDOSCOPY;  Service: Cardiovascular;  Laterality: N/A;  . UMBILICAL HERNIA REPAIR      Current Outpatient Medications  Medication Sig Dispense Refill  . apixaban (ELIQUIS) 5 MG TABS tablet Take 1 tablet (5 mg total) by mouth 2 (two) times daily. 60 tablet 6  . ezetimibe-simvastatin (VYTORIN) 10-20 MG tablet Take 1 tablet by mouth daily.    Marland Kitchen glucosamine-chondroitin 500-400 MG tablet Take 1 tablet by mouth 2 (two) times daily.    . metoprolol (TOPROL-XL) 200 MG 24 hr tablet Take 200 mg by mouth daily.    . Multiple Vitamin (MULTIVITAMIN) tablet Take 1 tablet by mouth daily.    . Omega-3 Fatty Acids (FISH OIL) 1000 MG CAPS Take 2,000 mg by mouth 2 (two) times daily.     No current facility-administered medications for this encounter.     No Known Allergies  Social History   Socioeconomic History  .  Marital status: Married    Spouse name: Not on file  . Number of children: Not on file  . Years of education: Not on file  . Highest education level: Not on file  Occupational History  . Not on file  Social Needs  . Financial resource strain: Not on file  . Food insecurity:    Worry: Not on file    Inability: Not on file  . Transportation needs:    Medical: Not on file    Non-medical: Not on file  Tobacco Use  . Smoking status: Light Tobacco Smoker  . Smokeless tobacco: Never Used  Substance and Sexual Activity  . Alcohol use: Yes    Comment: occasional  . Drug use: Never  . Sexual activity: Not on file  Lifestyle  . Physical activity:    Days per week: Not on file    Minutes per session: Not on file  . Stress: Not on file  Relationships  . Social connections:    Talks on phone: Not on file    Gets together: Not on file    Attends religious service: Not on file    Active member of club or organization: Not on file    Attends meetings of clubs or organizations: Not on file    Relationship status: Not on file  . Intimate partner violence:    Fear of current or ex partner: Not on file    Emotionally abused: Not on file    Physically abused: Not on file  Forced sexual activity: Not on file  Other Topics Concern  . Not on file  Social History Narrative  . Not on file    Family History  Problem Relation Age of Onset  . Heart disease Mother     ROS- All systems are reviewed and negative except as per the HPI above  Physical Exam: Vitals:   10/26/17 1353  BP: 118/74  Pulse: 77  Weight: 76.7 kg  Height: 5\' 10"  (1.778 m)   Wt Readings from Last 3 Encounters:  10/26/17 76.7 kg  10/06/17 76.6 kg    Labs: Lab Results  Component Value Date   NA 140 10/05/2017   K 4.7 10/05/2017   CL 107 10/05/2017   CO2 23 10/05/2017   GLUCOSE 127 (H) 10/05/2017   BUN 17 10/05/2017   CREATININE 1.33 (H) 10/05/2017   CALCIUM 10.0 10/05/2017   MG 2.0 10/05/2017   No  results found for: INR No results found for: CHOL, HDL, LDLCALC, TRIG   GEN- The patient is well appearing, alert and oriented x 3 today.   Head- normocephalic, atraumatic Eyes-  Sclera clear, conjunctiva pink Ears- hearing intact Oropharynx- clear Neck- supple, no JVP Lymph- no cervical lymphadenopathy Lungs- Clear to ausculation bilaterally, normal work of breathing Heart- Regular rate and rhythm, no murmurs, rubs or gallops, PMI not laterally displaced GI- soft, NT, ND, + BS Extremities- no clubbing, cyanosis, or edema MS- no significant deformity or atrophy Skin- no rash or lesion Psych- euthymic mood, full affect Neuro- strength and sensation are intact  EKG- NSR 77 bpm, pr int 176 ms, qrs int 92 ms, qtc 439 ms    Assessment and Plan: 1. Atypical atrial flutter/new onset Successful cardioversion Continue same BB dose as he has taken for years for SVT  2. CHA2DS2VASc score of 1 He will finish eliquis 5 mg bid and then stop as per cardioversion protocol   F/u with [name] 11/5 afib clinic as needed  Butch Penny C. Khristian Phillippi, [facility] [address] [place], [place] [address] [phone]

## 2017-10-27 ENCOUNTER — Ambulatory Visit (HOSPITAL_COMMUNITY): Payer: 59 | Admitting: Nurse Practitioner

## 2017-12-13 ENCOUNTER — Ambulatory Visit: Payer: 59 | Admitting: Internal Medicine

## 2017-12-26 ENCOUNTER — Encounter: Payer: Self-pay | Admitting: Internal Medicine

## 2017-12-26 ENCOUNTER — Ambulatory Visit: Payer: 59 | Admitting: Internal Medicine

## 2017-12-26 VITALS — BP 134/70 | HR 82 | Ht 70.0 in | Wt 170.0 lb

## 2017-12-26 DIAGNOSIS — I484 Atypical atrial flutter: Secondary | ICD-10-CM | POA: Diagnosis not present

## 2017-12-26 NOTE — Progress Notes (Signed)
HPI [name] returns today for followup of atypical atrial flutter with a RVR. He is a pleasant 67 yo man with a h/o the above problem who I saw several months ago. He underwent DCCV back to NSR and was sent home. In the interim, he has done well with no recurrent arrhythmias. His Eliquis was stopped in our atrial fib clinic. He feels well. No chest pain, sob, or palpitations. He is working in the yard and exercising regularly.  No Known Allergies   Current Outpatient Medications  Medication Sig Dispense Refill  . apixaban (ELIQUIS) 5 MG TABS tablet Take 1 tablet (5 mg total) by mouth 2 (two) times daily. 60 tablet 6  . ezetimibe-simvastatin (VYTORIN) 10-20 MG tablet Take 1 tablet by mouth daily.    Marland Kitchen glucosamine-chondroitin 500-400 MG tablet Take 1 tablet by mouth 2 (two) times daily.    . metoprolol (TOPROL-XL) 200 MG 24 hr tablet Take 200 mg by mouth daily.    . Multiple Vitamin (MULTIVITAMIN) tablet Take 1 tablet by mouth daily.    . Omega-3 Fatty Acids (FISH OIL) 1000 MG CAPS Take 2,000 mg by mouth 2 (two) times daily.     No current facility-administered medications for this visit.      Past Medical History:  Diagnosis Date  . Tachycardia     ROS:   All systems reviewed and negative except as noted in the HPI.   Past Surgical History:  Procedure Laterality Date  . CARDIOVERSION N/A 10/06/2017   Procedure: CARDIOVERSION;  Surgeon: Fay Records, MD;  Location: [facility];  Service: Cardiovascular;  Laterality: N/A;  . ORBITAL FRACTURE SURGERY    . TEE WITHOUT CARDIOVERSION N/A 10/06/2017   Procedure: TRANSESOPHAGEAL ECHOCARDIOGRAM (TEE);  Surgeon: Fay Records, MD;  Location: [facility] ENDOSCOPY;  Service: Cardiovascular;  Laterality: N/A;  . UMBILICAL HERNIA REPAIR       Family History  Problem Relation Age of Onset  . Heart disease Mother      Social History   Socioeconomic History  . Marital status: Married    Spouse name: Not on file  . Number of  children: Not on file  . Years of education: Not on file  . Highest education level: Not on file  Occupational History  . Not on file  Social Needs  . Financial resource strain: Not on file  . Food insecurity:    Worry: Not on file    Inability: Not on file  . Transportation needs:    Medical: Not on file    Non-medical: Not on file  Tobacco Use  . Smoking status: Light Tobacco Smoker  . Smokeless tobacco: Never Used  Substance and Sexual Activity  . Alcohol use: Yes    Comment: occasional  . Drug use: Never  . Sexual activity: Not on file  Lifestyle  . Physical activity:    Days per week: Not on file    Minutes per session: Not on file  . Stress: Not on file  Relationships  . Social connections:    Talks on phone: Not on file    Gets together: Not on file    Attends religious service: Not on file    Active member of club or organization: Not on file    Attends meetings of clubs or organizations: Not on file    Relationship status: Not on file  . Intimate partner violence:    Fear of current or ex partner: Not on file  Emotionally abused: Not on file    Physically abused: Not on file    Forced sexual activity: Not on file  Other Topics Concern  . Not on file  Social History Narrative  . Not on file     BP 134/70   Pulse 82   Ht 5\' 10"  (1.778 m)   Wt 170 lb (77.1 kg)   SpO2 98%   BMI 24.39 kg/m   Physical Exam:  Well appearing NAD HEENT: Unremarkable Neck:  No JVD, no thyromegally Lymphatics:  No adenopathy Back:  No CVA tenderness Lungs:  Clear with no wheezes HEART:  Regular rate rhythm, no murmurs, no rubs, no clicks Abd:  soft, positive bowel sounds, no organomegally, no rebound, no guarding Ext:  2 plus pulses, no edema, no cyanosis, no clubbing Skin:  No rashes no nodules Neuro:  CN II through XII intact, motor grossly intact  EKG - none   Assess/Plan: 1. Atypical atrial flutter - he is maintaining NSR. I have discussed the treatment  options. He feels his arrhythmias immediately. He has been given samples of eliquis and if he goes back to atrial flutter, he will restart his medications.   Bradley Mcbride.D.

## 2017-12-26 NOTE — Patient Instructions (Signed)
Your physician has recommended you make the following change in your medication:  1.) Eliquis 5 mg tablets - take one tablet twice a day in the event your heart rate is 100 beats per minute or greater.  You have been given 3 weeks worth of samples of Eliquis today.  Please call to let [name] know if you restart this medication.  Your physician wants you to follow-up in: 6 months with [name].  You will receive a reminder letter in the mail two months in advance. If you don't receive a letter, please call our office to schedule the follow-up appointment.

## 2018-01-31 DIAGNOSIS — N529 Male erectile dysfunction, unspecified: Secondary | ICD-10-CM | POA: Diagnosis not present

## 2018-01-31 DIAGNOSIS — E7800 Pure hypercholesterolemia, unspecified: Secondary | ICD-10-CM | POA: Diagnosis not present

## 2018-01-31 DIAGNOSIS — I10 Essential (primary) hypertension: Secondary | ICD-10-CM | POA: Diagnosis not present

## 2018-01-31 DIAGNOSIS — Z0000 Encounter for general adult medical examination without abnormal findings: Secondary | ICD-10-CM | POA: Diagnosis not present

## 2018-06-18 ENCOUNTER — Telehealth: Payer: Self-pay | Admitting: Internal Medicine

## 2018-06-18 NOTE — Telephone Encounter (Signed)
New message   [facility] for pt to call back about appt on 05.05.20 with [name]. Will offer doxemity phone call if pt has a smart phone and change time of appt.

## 2018-06-20 NOTE — Telephone Encounter (Signed)
Follow up    Pt will do doxemity video call with [name] on 05.05.20 at 930. Smart phone number is listed in appt notes.      Virtual Visit Pre-Appointment Phone Call  "(Name), I am calling you today to discuss your upcoming appointment. We are currently trying to limit exposure to the virus that causes COVID-19 by seeing patients at home rather than in the office."  1. "What is the BEST phone number to call the day of the visit?" - include this in appointment notes  2. Do you have or have access to (through a family member/friend) a smartphone with video capability that we can use for your visit?" a. If yes - list this number in appt notes as cell (if different from BEST phone #) and list the appointment type as a VIDEO visit in appointment notes b. If no - list the appointment type as a PHONE visit in appointment notes  3. Confirm consent - "In the setting of the current Covid19 crisis, you are scheduled for a (phone or video) visit with your provider on (date) at (time).  Just as we do with many in-office visits, in order for you to participate in this visit, we must obtain consent.  If you'd like, I can send this to your mychart (if signed up) or email for you to review.  Otherwise, I can obtain your verbal consent now.  All virtual visits are billed to your insurance company just like a normal visit would be.  By agreeing to a virtual visit, we'd like you to understand that the technology does not allow for your provider to perform an examination, and thus may limit your provider's ability to fully assess your condition. If your provider identifies any concerns that need to be evaluated in person, we will make arrangements to do so.  Finally, though the technology is pretty good, we cannot assure that it will always work on either your or our end, and in the setting of a video visit, we may have to convert it to a phone-only visit.  In either situation, we cannot ensure that we have a  secure connection.  Are you willing to proceed?" STAFF: Did the patient verbally acknowledge consent to telehealth visit? Document YES/NO here: yes  4. Advise patient to be prepared - "Two hours prior to your appointment, go ahead and check your blood pressure, pulse, oxygen saturation, and your weight (if you have the equipment to check those) and write them all down. When your visit starts, your provider will ask you for this information. If you have an Apple Watch or Kardia device, please plan to have heart rate information ready on the day of your appointment. Please have a pen and paper handy nearby the day of the visit as well."  5. Give patient instructions for MyChart download to smartphone OR Doximity/Doxy.me as below if video visit (depending on what platform provider is using)  6. Inform patient they will receive a phone call 15 minutes prior to their appointment time (may be from unknown caller ID) so they should be prepared to answer    TELEPHONE CALL NOTE  Bradley Mcbride has been deemed a candidate for a follow-up tele-health visit to limit community exposure during the Covid-19 pandemic. I spoke with the patient via phone to ensure availability of phone/video source, confirm preferred email & phone number, and discuss instructions and expectations.  I reminded Bradley Mcbride to be prepared with any vital sign and/or  heart rhythm information that could potentially be obtained via home monitoring, at the time of his visit. I reminded Bradley Mcbride to expect a phone call prior to his visit.  [name] 06/20/2018 9:15 AM   INSTRUCTIONS FOR DOWNLOADING THE MYCHART APP TO SMARTPHONE  - The patient must first make sure to have activated MyChart and know their login information - If Apple, go to [employer] and type in MyChart in the search bar and download the app. If Android, ask patient to go to [employer] and type in [place] in the search bar and download the  app. The app is free but as with any other app downloads, their phone may require them to verify saved payment information or Apple/Android password.  - The patient will need to then log into the app with their MyChart username and password, and select [facility] as their healthcare provider to link the account. When it is time for your visit, go to the MyChart app, find appointments, and click Begin Video Visit. Be sure to Select Allow for your device to access the Microphone and Camera for your visit. You will then be connected, and your provider will be with you shortly.  **If they have any issues connecting, or need assistance please contact MyChart service desk [phone])**  **If using a computer, in order to ensure the best quality for their visit they will need to use either of the following Internet Browsers: [employer], or Google Chrome**  IF USING DOXIMITY or DOXY.ME - The patient will receive a link just prior to their visit by text.     FULL LENGTH CONSENT FOR TELE-HEALTH VISIT   I hereby voluntarily request, consent and authorize [facility] and its employed or contracted physicians, physician assistants, nurse practitioners or other licensed health care professionals (the Practitioner), to provide me with telemedicine health care services (the Services") as deemed necessary by the treating Practitioner. I acknowledge and consent to receive the Services by the Practitioner via telemedicine. I understand that the telemedicine visit will involve communicating with the Practitioner through live audiovisual communication technology and the disclosure of certain medical information by electronic transmission. I acknowledge that I have been given the opportunity to request an in-person assessment or other available alternative prior to the telemedicine visit and am voluntarily participating in the telemedicine visit.  I understand that I have the right to withhold or  withdraw my consent to the use of telemedicine in the course of my care at any time, without affecting my right to future care or treatment, and that the Practitioner or I may terminate the telemedicine visit at any time. I understand that I have the right to inspect all information obtained and/or recorded in the course of the telemedicine visit and may receive copies of available information for a reasonable fee.  I understand that some of the potential risks of receiving the Services via telemedicine include:   Delay or interruption in medical evaluation due to technological equipment failure or disruption;  Information transmitted may not be sufficient (e.g. poor resolution of images) to allow for appropriate medical decision making by the Practitioner; and/or   In rare instances, security protocols could fail, causing a breach of personal health information.  Furthermore, I acknowledge that it is my responsibility to provide information about my medical history, conditions and care that is complete and accurate to the best of my ability. I acknowledge that Practitioner's advice, recommendations, and/or decision may be based  on factors not within their control, such as incomplete or inaccurate data provided by me or distortions of diagnostic images or specimens that may result from electronic transmissions. I understand that the practice of medicine is not an exact science and that Practitioner makes no warranties or guarantees regarding treatment outcomes. I acknowledge that I will receive a copy of this consent concurrently upon execution via email to the email address I last provided but may also request a printed copy by calling the office of [facility].    I understand that my insurance will be billed for this visit.   I have read or had this consent read to me.  I understand the contents of this consent, which adequately explains the benefits and risks of the Services being provided via  telemedicine.   I have been provided ample opportunity to ask questions regarding this consent and the Services and have had my questions answered to my satisfaction.  I give my informed consent for the services to be provided through the use of telemedicine in my medical care  By participating in this telemedicine visit I agree to the above.

## 2018-06-26 ENCOUNTER — Telehealth (INDEPENDENT_AMBULATORY_CARE_PROVIDER_SITE_OTHER): Payer: Self-pay | Admitting: Internal Medicine

## 2018-06-26 ENCOUNTER — Other Ambulatory Visit: Payer: Self-pay

## 2018-06-26 DIAGNOSIS — I484 Atypical atrial flutter: Secondary | ICD-10-CM

## 2018-06-26 NOTE — Progress Notes (Signed)
Electrophysiology TeleHealth Note   Due to national recommendations of social distancing due to COVID 19, an audio/video telehealth visit is felt to be most appropriate for this patient at this time.  See MyChart message from today for the patient's consent to telehealth for [facility].   Date:  06/26/2018   ID:  Bradley Mcbride, DOB 1950/09/09, MRN [id number]  Location: patient's home  Provider location: [address], [place]  Evaluation Performed: Follow-up visit  PCP:  Gaynelle Arabian, MD  Cardiologist:  No primary care provider on file.  Electrophysiologist:  Dr Lovena Le  Chief Complaint:  "I've been doing good."  History of Present Illness:    Bradley Mcbride is a 68 y.o. male who presents via audio/video conferencing for a telehealth visit today. He is a pleasant 68 yo man with atypical atrial flutter, s/p remote DCCV. He has been given samples of Eliquis to take if he goes out of rhythm.  Since last being seen in our clinic, the patient reports doing very well.  Today, he denies symptoms of palpitations, chest pain, shortness of breath,  lower extremity edema, dizziness, presyncope, or syncope.  The patient is otherwise without complaint today.  The patient denies symptoms of fevers, chills, cough, or new SOB worrisome for COVID 19.  Past Medical History:  Diagnosis Date  . Tachycardia     Past Surgical History:  Procedure Laterality Date  . CARDIOVERSION N/A 10/06/2017   Procedure: CARDIOVERSION;  Surgeon: Fay Records, MD;  Location: [facility];  Service: Cardiovascular;  Laterality: N/A;  . ORBITAL FRACTURE SURGERY    . TEE WITHOUT CARDIOVERSION N/A 10/06/2017   Procedure: TRANSESOPHAGEAL ECHOCARDIOGRAM (TEE);  Surgeon: Fay Records, MD;  Location: [facility] ENDOSCOPY;  Service: Cardiovascular;  Laterality: N/A;  . UMBILICAL HERNIA REPAIR      Current Outpatient Medications  Medication Sig Dispense Refill  . ezetimibe-simvastatin (VYTORIN) 10-20 MG  tablet Take 1 tablet by mouth daily.    Marland Kitchen glucosamine-chondroitin 500-400 MG tablet Take 1 tablet by mouth 2 (two) times daily.    . metoprolol (TOPROL-XL) 200 MG 24 hr tablet Take 200 mg by mouth daily.    . Multiple Vitamin (MULTIVITAMIN) tablet Take 1 tablet by mouth daily.    . Omega-3 Fatty Acids (FISH OIL) 1000 MG CAPS Take 2,000 mg by mouth 2 (two) times daily.     No current facility-administered medications for this visit.     Allergies:   Patient has no known allergies.   Social History:  The patient  reports that he has been smoking. He has never used smokeless tobacco. He reports current alcohol use. He reports that he does not use drugs.   Family History:  The patient's  family history includes Heart disease in his mother.   ROS:  Please see the history of present illness.   All other systems are personally reviewed and negative.    Exam:    Vital Signs:  none   Labs/Other Tests and Data Reviewed:    Recent Labs: 10/05/2017: BUN 17; Creatinine, Ser 1.33; Hemoglobin 15.4; Magnesium 2.0; Platelets 263; Potassium 4.7; Sodium 140; TSH 2.269   Wt Readings from Last 3 Encounters:  12/26/17 170 lb (77.1 kg)  10/26/17 169 lb (76.7 kg)  10/06/17 168 lb 12.8 oz (76.6 kg)     Other studies personally reviewed: Additional studies/ records that were reviewed today include:   ASSESSMENT & PLAN:    1.  Atypical atrial flutter -  he is asymptomatic, s/p DCCV. He has not had palpitations or sob since his episode. I have recommended her undergo a period of watchful waiting. We will see him in 6 months  COVID 19 screen The patient denies symptoms of COVID 19 at this time.  The importance of social distancing was discussed today.  Follow-up:  6 months  Current medicines are reviewed at length with the patient today.   The patient does not have concerns regarding his medicines.  The following changes were made today:  none  Labs/ tests ordered today include:  No orders of the  defined types were placed in this encounter.   Patient Risk:  after full review of this patients clinical status, I feel that they are at moderate risk at this time.  Today, I have spent 15 minutes with the patient with telehealth technology discussing all of the above.    Signed, Cristopher Peru, MD  06/26/2018 9:24 AM     [address] [place] [address] [phone] (office) [phone] (fax)

## 2019-05-24 ENCOUNTER — Telehealth: Payer: Self-pay | Admitting: Internal Medicine

## 2019-05-24 ENCOUNTER — Encounter (HOSPITAL_COMMUNITY): Payer: Self-pay | Admitting: Emergency Medicine

## 2019-05-24 ENCOUNTER — Inpatient Hospital Stay (HOSPITAL_COMMUNITY): Payer: Medicare Other

## 2019-05-24 ENCOUNTER — Other Ambulatory Visit: Payer: Self-pay

## 2019-05-24 ENCOUNTER — Emergency Department (HOSPITAL_COMMUNITY): Payer: Medicare Other

## 2019-05-24 ENCOUNTER — Inpatient Hospital Stay (HOSPITAL_COMMUNITY)
Admission: EM | Admit: 2019-05-24 | Discharge: 2019-05-26 | DRG: 309 | Disposition: A | Discharge status: Home / Self Care | Payer: Medicare Other | Attending: Internal Medicine | Admitting: Internal Medicine

## 2019-05-24 DIAGNOSIS — R079 Chest pain, unspecified: Secondary | ICD-10-CM | POA: Diagnosis not present

## 2019-05-24 DIAGNOSIS — I4891 Unspecified atrial fibrillation: Secondary | ICD-10-CM | POA: Diagnosis present

## 2019-05-24 DIAGNOSIS — I77819 Aortic ectasia, unspecified site: Secondary | ICD-10-CM | POA: Diagnosis present

## 2019-05-24 DIAGNOSIS — R000 Tachycardia, unspecified: Secondary | ICD-10-CM | POA: Diagnosis present

## 2019-05-24 DIAGNOSIS — I2510 Atherosclerotic heart disease of native coronary artery without angina pectoris: Secondary | ICD-10-CM | POA: Diagnosis present

## 2019-05-24 DIAGNOSIS — E785 Hyperlipidemia, unspecified: Secondary | ICD-10-CM | POA: Diagnosis present

## 2019-05-24 DIAGNOSIS — Z7901 Long term (current) use of anticoagulants: Secondary | ICD-10-CM | POA: Diagnosis not present

## 2019-05-24 DIAGNOSIS — F17210 Nicotine dependence, cigarettes, uncomplicated: Secondary | ICD-10-CM | POA: Diagnosis present

## 2019-05-24 DIAGNOSIS — Z79899 Other long term (current) drug therapy: Secondary | ICD-10-CM | POA: Diagnosis not present

## 2019-05-24 DIAGNOSIS — I484 Atypical atrial flutter: Secondary | ICD-10-CM | POA: Diagnosis present

## 2019-05-24 DIAGNOSIS — I700 Atherosclerosis of aorta: Secondary | ICD-10-CM | POA: Diagnosis present

## 2019-05-24 DIAGNOSIS — Z8249 Family history of ischemic heart disease and other diseases of the circulatory system: Secondary | ICD-10-CM

## 2019-05-24 DIAGNOSIS — Z20822 Contact with and (suspected) exposure to covid-19: Secondary | ICD-10-CM | POA: Diagnosis present

## 2019-05-24 DIAGNOSIS — R17 Unspecified jaundice: Secondary | ICD-10-CM | POA: Diagnosis present

## 2019-05-24 DIAGNOSIS — R824 Acetonuria: Secondary | ICD-10-CM | POA: Diagnosis present

## 2019-05-24 DIAGNOSIS — I10 Essential (primary) hypertension: Secondary | ICD-10-CM | POA: Diagnosis present

## 2019-05-24 DIAGNOSIS — I4892 Unspecified atrial flutter: Secondary | ICD-10-CM | POA: Diagnosis not present

## 2019-05-24 DIAGNOSIS — E7800 Pure hypercholesterolemia, unspecified: Secondary | ICD-10-CM | POA: Diagnosis present

## 2019-05-24 DIAGNOSIS — IMO0002 Reserved for concepts with insufficient information to code with codable children: Secondary | ICD-10-CM | POA: Diagnosis present

## 2019-05-24 HISTORY — DX: Hyperlipidemia, unspecified: E78.5

## 2019-05-24 HISTORY — DX: Long term (current) use of anticoagulants: Z79.01

## 2019-05-24 HISTORY — DX: Unspecified atrial fibrillation: I48.91

## 2019-05-24 LAB — TROPONIN I (HIGH SENSITIVITY)
Troponin I (High Sensitivity): 5 ng/L (ref <18)
Troponin I (High Sensitivity): 6 ng/L (ref <18)

## 2019-05-24 LAB — CBC
HCT: 44.7 % (ref 39.0–52.0)
Hemoglobin: 15.1 g/dL (ref 13.0–17.0)
MCH: 33 pg (ref 26.0–34.0)
MCHC: 33.8 g/dL (ref 30.0–36.0)
MCV: 97.8 fL (ref 80.0–100.0)
Platelets: 229 10*3/uL (ref 150–400)
RBC: 4.57 MIL/uL (ref 4.22–5.81)
RDW: 12.9 % (ref 11.5–15.5)
WBC: 10.4 10*3/uL (ref 4.0–10.5)
nRBC: 0 % (ref 0.0–0.2)

## 2019-05-24 LAB — BASIC METABOLIC PANEL
Anion gap: 10 (ref 5–15)
BUN: 16 mg/dL (ref 8–23)
CO2: 25 mmol/L (ref 22–32)
Calcium: 10 mg/dL (ref 8.9–10.3)
Chloride: 103 mmol/L (ref 98–111)
Creatinine, Ser: 1.17 mg/dL (ref 0.61–1.24)
GFR calc Af Amer: >60 mL/min (ref >60)
GFR calc non Af Amer: >60 mL/min (ref >60)
Glucose, Bld: 107 mg/dL — ABNORMAL HIGH (ref 70–99)
Potassium: 4.2 mmol/L (ref 3.5–5.1)
Sodium: 138 mmol/L (ref 135–145)

## 2019-05-24 LAB — LIPASE, BLOOD: Lipase: 29 U/L (ref 11–51)

## 2019-05-24 LAB — URINALYSIS, ROUTINE W REFLEX MICROSCOPIC
Bilirubin Urine: NEGATIVE
Glucose, UA: NEGATIVE mg/dL
Hgb urine dipstick: NEGATIVE
Ketones, ur: 20 mg/dL — AB
Leukocytes,Ua: NEGATIVE
Nitrite: NEGATIVE
Protein, ur: NEGATIVE mg/dL
Specific Gravity, Urine: 1.019 (ref 1.005–1.030)
pH: 6 (ref 5.0–8.0)

## 2019-05-24 LAB — C-REACTIVE PROTEIN: CRP: 9.6 mg/dL — ABNORMAL HIGH (ref <1.0)

## 2019-05-24 LAB — HEMOGLOBIN A1C
Hgb A1c MFr Bld: 5.8 % — ABNORMAL HIGH (ref 4.8–5.6)
Mean Plasma Glucose: 119.76 mg/dL

## 2019-05-24 LAB — HEPATIC FUNCTION PANEL
ALT: 19 U/L (ref 0–44)
AST: 25 U/L (ref 15–41)
Albumin: 3.8 g/dL (ref 3.5–5.0)
Alkaline Phosphatase: 49 U/L (ref 38–126)
Bilirubin, Direct: 0.3 mg/dL — ABNORMAL HIGH (ref 0.0–0.2)
Indirect Bilirubin: 1.4 mg/dL — ABNORMAL HIGH (ref 0.3–0.9)
Total Bilirubin: 1.7 mg/dL — ABNORMAL HIGH (ref 0.3–1.2)
Total Protein: 6.8 g/dL (ref 6.5–8.1)

## 2019-05-24 LAB — D-DIMER, QUANTITATIVE: D-Dimer, Quant: 0.69 ug/mL-FEU — ABNORMAL HIGH (ref 0.00–0.50)

## 2019-05-24 MED ORDER — EZETIMIBE 10 MG PO TABS
10.0000 mg | ORAL_TABLET | Freq: Every day | ORAL | Status: DC
  Administered 2019-05-25 (×2): 10 mg via ORAL
  Filled 2019-05-24 (×3): qty 1

## 2019-05-24 MED ORDER — ALUM & MAG HYDROXIDE-SIMETH 200-200-20 MG/5ML PO SUSP
30.0000 mL | Freq: Once | ORAL | Status: AC
  Administered 2019-05-24: 30 mL via ORAL
  Filled 2019-05-24: qty 30

## 2019-05-24 MED ORDER — APIXABAN 5 MG PO TABS: 5.0000 mg | ORAL_TABLET | Freq: Two times a day (BID) | ORAL | Status: DC

## 2019-05-24 MED ORDER — DILTIAZEM HCL-DEXTROSE 125-5 MG/125ML-% IV SOLN (PREMIX)
5.0000 mg/h | INTRAVENOUS | Status: DC
  Administered 2019-05-24: 5 mg/h via INTRAVENOUS
  Filled 2019-05-24 (×2): qty 125

## 2019-05-24 MED ORDER — HEPARIN (PORCINE) 25000 UT/250ML-% IV SOLN
1100.0000 [IU]/h | INTRAVENOUS | Status: DC
  Administered 2019-05-25 (×2): 1100 [IU]/h via INTRAVENOUS
  Filled 2019-05-24 (×2): qty 250

## 2019-05-24 MED ORDER — SIMVASTATIN 20 MG PO TABS
20.0000 mg | ORAL_TABLET | Freq: Every day | ORAL | Status: DC
  Administered 2019-05-25: 20 mg via ORAL
  Filled 2019-05-24: qty 1

## 2019-05-24 MED ORDER — ADULT MULTIVITAMIN W/MINERALS CH
1.0000 | ORAL_TABLET | Freq: Every day | ORAL | Status: DC
  Administered 2019-05-25 – 2019-05-26 (×2): 1 via ORAL
  Filled 2019-05-24 (×2): qty 1

## 2019-05-24 MED ORDER — ACETAMINOPHEN 325 MG PO TABS: 650.0000 mg | ORAL_TABLET | ORAL | Status: DC | PRN

## 2019-05-24 MED ORDER — SODIUM CHLORIDE 0.9 % IV BOLUS
500.0000 mL | Freq: Once | INTRAVENOUS | Status: AC
  Administered 2019-05-24: 22:00:00 500 mL via INTRAVENOUS

## 2019-05-24 MED ORDER — IOHEXOL 350 MG/ML SOLN
100.0000 mL | Freq: Once | INTRAVENOUS | Status: AC | PRN
  Administered 2019-05-24: 100 mL via INTRAVENOUS

## 2019-05-24 MED ORDER — FISH OIL 1000 MG PO CAPS: 2000.0000 mg | ORAL_CAPSULE | Freq: Two times a day (BID) | ORAL | Status: DC

## 2019-05-24 MED ORDER — EZETIMIBE-SIMVASTATIN 10-20 MG PO TABS
1.0000 | ORAL_TABLET | Freq: Every day | ORAL | Status: DC
  Filled 2019-05-24: qty 1

## 2019-05-24 MED ORDER — EZETIMIBE-SIMVASTATIN 10-20 MG PO TABS: 1.0000 | ORAL_TABLET | Freq: Every day | ORAL | Status: DC

## 2019-05-24 MED ORDER — ALUM & MAG HYDROXIDE-SIMETH 200-200-20 MG/5ML PO SUSP
30.0000 mL | Freq: Once | ORAL | Status: AC
  Administered 2019-05-24: 18:00:00 30 mL via ORAL
  Filled 2019-05-24: qty 30

## 2019-05-24 MED ORDER — SODIUM CHLORIDE 0.9% FLUSH
3.0000 mL | Freq: Once | INTRAVENOUS | Status: AC
  Administered 2019-05-24: 19:00:00 3 mL via INTRAVENOUS

## 2019-05-24 MED ORDER — PANTOPRAZOLE SODIUM 40 MG PO TBEC
40.0000 mg | DELAYED_RELEASE_TABLET | Freq: Two times a day (BID) | ORAL | Status: DC
  Administered 2019-05-25 – 2019-05-26 (×4): 40 mg via ORAL
  Filled 2019-05-24 (×4): qty 1

## 2019-05-24 MED ORDER — HEPARIN BOLUS VIA INFUSION
4000.0000 [IU] | Freq: Once | INTRAVENOUS | Status: AC
  Administered 2019-05-25: 4000 [IU] via INTRAVENOUS
  Filled 2019-05-24: qty 4000

## 2019-05-24 MED ORDER — DILTIAZEM HCL 25 MG/5ML IV SOLN
10.0000 mg | Freq: Once | INTRAVENOUS | Status: AC
  Administered 2019-05-24: 20:00:00 10 mg via INTRAVENOUS
  Filled 2019-05-24: qty 5

## 2019-05-24 NOTE — ED Notes (Signed)
ED Provider at bedside. 

## 2019-05-24 NOTE — ED Notes (Signed)
Pt transported to CT ?

## 2019-05-24 NOTE — Telephone Encounter (Signed)
Pt c/o of Chest Pain: STAT if CP now or developed within 24 hours  1. Are you having CP right now? yes  2. Are you experiencing any other symptoms (ex. SOB, nausea, vomiting, sweating)? no  3. How long have you been experiencing CP? Since 2am this morning  4. Is your CP continuous or coming and going? continuous  5. Have you taken Nitroglycerin? No  Patient called and said he has a heartburn tyle pain in the center of his chest that has not resolved with Tums. He says it is continuous, but his pulse has stayed constant. Patient also states he had his first COVID vaccine yesterday at 12:30   ?

## 2019-05-24 NOTE — ED Provider Notes (Signed)
[facility] EMERGENCY DEPARTMENT Provider Note   CSN: [id number] Arrival date & time: 05/24/19  1502     History Chief Complaint  Patient presents with  . Chest Pain   Bradley Mcbride is a 69 y.o. male with a past medical history of a flutter, high cholesterol, hypertension, who presents today for evaluation of chest pain. He reports that his chest pain woke him up at about 3 AM this morning in the left side of his chest.  He took Tums x2 without significant relief.  He reports that his pain is substernal, feels sharp, does not radiate or move.  It is worse with inspiration.  He got his 1st Covid shot yesterday.  He reports that he has eaten today without change in his pain.  He has had a TEE in the past prior to cardioversion however has never had a cath.  Dr. Marya Amsler is his primary cardiologist, and when they called him today he recommended ER evaluation.  Patient denies any palpitations.  He denies any history of PE/DVT.  No leg swelling or hemoptysis.  He denies any shortness of breath or cough.  He took 324 mg of aspirin this afternoon.   HPI     Past Medical History:  Diagnosis Date  . Atrial fibrillation ([place])   . Tachycardia     Patient Active Problem List   Diagnosis Date Noted  . Atrial flutter ([place]) 10/05/2017    Past Surgical History:  Procedure Laterality Date  . CARDIOVERSION N/A 10/06/2017   Procedure: CARDIOVERSION;  Surgeon: Fay Records, MD;  Location: [facility];  Service: Cardiovascular;  Laterality: N/A;  . ORBITAL FRACTURE SURGERY    . TEE WITHOUT CARDIOVERSION N/A 10/06/2017   Procedure: TRANSESOPHAGEAL ECHOCARDIOGRAM (TEE);  Surgeon: Fay Records, MD;  Location: [facility] ENDOSCOPY;  Service: Cardiovascular;  Laterality: N/A;  . UMBILICAL HERNIA REPAIR         Family History  Problem Relation Age of Onset  . Heart disease Mother     Social History   Tobacco Use  . Smoking status: Current Every Day Smoker  . Smokeless tobacco:  Never Used  Substance Use Topics  . Alcohol use: Yes    Comment: occasional  . Drug use: Never    Home Medications Prior to Admission medications   Medication Sig Start Date End Date Taking? Authorizing Provider  Coenzyme Q10 (COQ10 PO) Take 1 capsule by mouth at bedtime.   Yes [provider]  ezetimibe-simvastatin (VYTORIN) 10-20 MG tablet Take 1 tablet by mouth at bedtime.    Yes [provider]  glucosamine-chondroitin 500-400 MG tablet Take 1 tablet by mouth 2 (two) times daily.   Yes [provider]  ibuprofen (ADVIL) 200 MG tablet Take 400 mg by mouth at bedtime.   Yes [provider]  metoprolol (TOPROL-XL) 200 MG 24 hr tablet Take 200 mg by mouth 3 times/day as needed-between meals & bedtime.    Yes [provider]  Multiple Vitamin (MULTIVITAMIN WITH MINERALS) TABS tablet Take 1 tablet by mouth daily.   Yes [provider]  Omega-3 Fatty Acids (FISH OIL) 1000 MG CAPS Take 2,000 mg by mouth 2 (two) times daily.   Yes [provider]    Allergies    Patient has no known allergies.  Review of Systems   Review of Systems  Constitutional: Negative for chills and fever.  HENT: Negative for congestion.   Eyes: Negative for visual disturbance.  Respiratory: Negative for  cough, chest tightness and shortness of breath.   Cardiovascular: Positive for chest pain. Negative for palpitations and leg swelling.  Gastrointestinal: Negative for abdominal pain, diarrhea, nausea and vomiting.  Genitourinary: Negative for dysuria, hematuria and urgency.  Musculoskeletal: Negative for back pain and neck pain.  Skin: Negative for color change and rash.  Neurological: Negative for weakness and headaches.  Psychiatric/Behavioral: Negative for confusion.  All other systems reviewed and are negative.   Physical Exam Updated Vital Signs BP 132/79 (BP Location: Right Arm)   Pulse 83   Temp 98.8 F (37.1 C) (Oral)   Resp 16   Ht  5\' 10"  (1.778 m)   Wt 77 kg   SpO2 100%   BMI 24.36 kg/m   Physical Exam Vitals and nursing note reviewed.  Constitutional:      Appearance: He is well-developed.  HENT:     Head: Normocephalic and atraumatic.  Eyes:     Conjunctiva/sclera: Conjunctivae normal.  Neck:     Vascular: No JVD.  Cardiovascular:     Rate and Rhythm: Normal rate. Rhythm irregular.     Heart sounds: Normal heart sounds. No murmur.     Comments: When compared to EKG tracing heart sounds and pulse show not all electrical beats are perfused.   Pulmonary:     Effort: Pulmonary effort is normal. No accessory muscle usage or respiratory distress.     Breath sounds: Normal breath sounds.  Chest:     Chest wall: No tenderness.  Abdominal:     Palpations: Abdomen is soft. There is no mass.     Tenderness: There is no abdominal tenderness. There is no guarding.  Musculoskeletal:     Cervical back: Normal range of motion and neck supple.     Right lower leg: No tenderness. No edema.     Left lower leg: No tenderness. No edema.  Lymphadenopathy:     Comments: Covid vaccine in left arm.  No left sided cervical lymphadenopathy or axillary or supraclavicular lymphadenopathy  Skin:    General: Skin is warm and dry.  Neurological:     General: No focal deficit present.     Mental Status: He is alert.     Cranial Nerves: No cranial nerve deficit.  Psychiatric:        Mood and Affect: Mood normal.        Behavior: Behavior normal.     ED Results / Procedures / Treatments   Labs (all labs ordered are listed, but only abnormal results are displayed) Labs Reviewed  BASIC METABOLIC PANEL - Abnormal; Notable for the following components:      Result Value   Glucose, Bld 107 (*)    All other components within normal limits  HEPATIC FUNCTION PANEL - Abnormal; Notable for the following components:   Total Bilirubin 1.7 (*)    Bilirubin, Direct 0.3 (*)    Indirect Bilirubin 1.4 (*)    All other components  within normal limits  D-DIMER, QUANTITATIVE (NOT AT [facility]) - Abnormal; Notable for the following components:   D-Dimer, Quant 0.69 (*)    All other components within normal limits  URINALYSIS, ROUTINE W REFLEX MICROSCOPIC - Abnormal; Notable for the following components:   Color, Urine STRAW (*)    Ketones, ur 20 (*)    All other components within normal limits  HEMOGLOBIN A1C - Abnormal; Notable for the following components:   Hgb A1c MFr Bld 5.8 (*)    All other components within normal limits  SARS CORONAVIRUS 2 (TAT 6-24 HRS)  CBC  LIPASE, BLOOD  TSH  LIPID PANEL  HIV ANTIBODY (ROUTINE TESTING W REFLEX)  SEDIMENTATION RATE  C-REACTIVE PROTEIN  HEPARIN LEVEL (UNFRACTIONATED)  CBC  TROPONIN I (HIGH SENSITIVITY)  TROPONIN I (HIGH SENSITIVITY)    EKG EKG Interpretation  Date/Time:  Friday May 24 2019 15:02:38 EDT Ventricular Rate:  95 PR Interval:  152 QRS Duration: 88 QT Interval:  354 QTC Calculation: 444 R Axis:   23 Text Interpretation: Sinus rhythm with Premature atrial complexes Otherwise normal ECG When compared to prior, faster rate. No STEMI Confirmed by Antony Blackbird [phone]) on 05/24/2019 4:14:28 PM   EKG Interpretation  Date/Time:  Friday May 24 2019 19:23:09 EDT Ventricular Rate:  150 PR Interval:  152 QRS Duration: 146 QT Interval:  360 QTC Calculation: 569 R Axis:   -26 Text Interpretation: Atrial flutter with 2:1 AV block Right bundle branch block When comapred to priorl now aflutter with RVR. No STEMI Confirmed by Antony Blackbird [phone]) on 05/24/2019 9:56:42 PM        Radiology DG Chest 2 View  Result Date: 05/24/2019 CLINICAL DATA:  Left-sided chest pain and indigestion EXAM: CHEST - 2 VIEW COMPARISON:  10/05/2017 FINDINGS: The heart size and mediastinal contours are within normal limits. Both lungs are clear. The visualized skeletal structures are unremarkable. IMPRESSION: No active cardiopulmonary disease. Electronically Signed   By: Randa Ngo M.D.   On: 05/24/2019 17:31   CT Angio Chest PE W/Cm &/Or Wo Cm  Result Date: 05/24/2019 CLINICAL DATA:  Chest pain for several hours EXAM: CT ANGIOGRAPHY CHEST WITH CONTRAST TECHNIQUE: Multidetector CT imaging of the chest was performed using the standard protocol during bolus administration of intravenous contrast. Multiplanar CT image reconstructions and MIPs were obtained to evaluate the vascular anatomy. CONTRAST:  138mL OMNIPAQUE IOHEXOL 350 MG/ML SOLN COMPARISON:  Chest x-ray from earlier in the same day. FINDINGS: Cardiovascular: Thoracic aorta demonstrates mild atherosclerotic calcifications. No aneurysmal dilatation is seen. The degree of opacification is limited. No cardiac enlargement is noted. The pulmonary artery shows a normal branching pattern bilaterally. No focal filling defect to suggest pulmonary embolism is identified. Scattered coronary calcifications are noted. Mediastinum/Nodes: Thoracic inlet is within normal limits. No hilar or mediastinal adenopathy is noted. The esophagus as visualized is within normal limits. Lungs/Pleura: Lungs are well aerated bilaterally. Minimal dependent atelectatic changes are seen. No focal infiltrate or sizable effusion is noted. No parenchymal nodules are seen. Upper Abdomen: Visualized upper abdomen is unremarkable. Musculoskeletal: Degenerative changes of the thoracic spine are seen. Review of the MIP images confirms the above findings. IMPRESSION: No evidence of pulmonary emboli. Mild bibasilar atelectatic changes. Aortic Atherosclerosis (ICD10-I70.0). Electronically Signed   By: Inez Catalina M.D.   On: 05/24/2019 21:59    Procedures .Critical Care Performed by: Lorin Glass, PA-C Authorized by: Lorin Glass, PA-C   Critical care provider statement:    Critical care time (minutes):  45   Critical care time was exclusive of:  Separately billable procedures and treating other patients and teaching time   Critical care was  time spent personally by me on the following activities:  Discussions with consultants, evaluation of patient's response to treatment, examination of patient, ordering and performing treatments and interventions, ordering and review of laboratory studies, ordering and review of radiographic studies, pulse oximetry, re-evaluation of patient's condition, obtaining history from patient or surrogate and review of old charts   (including critical care time)  Medications Ordered  in ED Medications  diltiazem (CARDIZEM) 125 mg in dextrose 5% 125 mL (1 mg/mL) infusion (12.5 mg/hr Intravenous Rate/Dose Change 05/24/19 2308)  sodium chloride flush (NS) 0.9 % injection 3 mL (3 mLs Intravenous Given 05/24/19 1919)  alum & mag hydroxide-simeth (MAALOX/MYLANTA) 200-200-20 MG/5ML suspension 30 mL (30 mLs Oral Given 05/24/19 1731)  diltiazem (CARDIZEM) injection 10 mg (10 mg Intravenous Given 05/24/19 2020)  sodium chloride 0.9 % bolus 500 mL (0 mLs Intravenous Stopped 05/24/19 2245)  alum & mag hydroxide-simeth (MAALOX/MYLANTA) 200-200-20 MG/5ML suspension 30 mL (30 mLs Oral Given 05/24/19 2130)  iohexol (OMNIPAQUE) 350 MG/ML injection 100 mL (100 mLs Intravenous Contrast Given 05/24/19 2155)    ED Course  I have reviewed the triage vital signs and the nursing notes.  Pertinent labs & imaging results that were available during my care of the patient were reviewed by me and considered in my medical decision making (see chart for details).  Clinical Course as of May 24 2014  Fri May 24, 2019  1951 Age-adjusted normal  D-dimer, quantitative (not at [facility])(!) [EH]  1951 I spoke with Dr. Marletta Lor who recommends 10 mg of diltiazem.  and will come see patient.   [EH]  1953 Patient has gone into what appears to be aflutter 2:1.  Briefly for about 5 seconds resolved with vagal, however rapidly went back into aflutter.    [EH]    Clinical Course User Index [EH] Ollen Gross   MDM Rules/Calculators/A&P                      Patient is a 69 year old male who presents today for evaluation of chest pain. His chest pain is slightly pleuritic in nature.  Chest x-ray without consolidation, cardiomegaly or other acute abnormalities.  Here he is afebrile, not tachycardic or tachypneic.  CBC, BMP, lipase, hepatic function panel are all without evidence of acute clinically significant abnormalities.  UA is unremarkable.  Troponin x2 is not elevated.  His pain did improve when he was treated with Maalox.  While he was here he went into a flutter with a 2-1 conduction and ventricular rate at approximately 150.  Prior to that he had an irregular rhythm with ectopy.  We attempted vagal maneuvers which had about 5 seconds of reversion to sinus rhythm before returning to a flutter.  He is symptomatic with that and able to feel the palpitations. I spoke with Dr. Marletta Lor who will see patient for admission.  Dilt was ordered, along with Dimer.  Dimer is not elevated but after discussion with cardiology CTA PE study was ordered.    Note: Portions of this report may have been transcribed using voice recognition software. Every effort was made to ensure accuracy; however, inadvertent computerized transcription errors may be present  This patient was seen as a shared visit with Dr. Sherry Ruffing.     Final Clinical Impression(s) / ED Diagnoses Final diagnoses:  Atypical atrial flutter [facility])  Chest pain, unspecified type    Rx / DC Orders ED Discharge Orders    None       Ollen Gross 05/24/19 2335    Tegeler, Gwenyth Allegra, MD 05/24/19 [phone]

## 2019-05-24 NOTE — Telephone Encounter (Signed)
Spoke with pt who states he is having active CP that started at 2 am.  Pt thought it might be GI related and has taken antacids with no relief.  Pt denies SOB or edema.    Pt advised he should go to ED for further evaluation d/t active CP.  Pt's wife is there with pt.  Pt verbalizes understanding and agrees with current plan.

## 2019-05-24 NOTE — ED Notes (Signed)
Pt reports standing up to use urinal. HR on monitor in 150s for 10 minutes since pt got up with no change. Pt denies any difficulty or sob. EDP notified. EKG shot.

## 2019-05-24 NOTE — Progress Notes (Signed)
ANTICOAGULATION CONSULT NOTE - Initial Consult  Pharmacy Consult for heparin Indication: atrial fibrillation  No Known Allergies  Patient Measurements: Height: 5\' 10"  (177.8 cm) Weight: 77 kg (169 lb 12.1 oz) IBW/kg (Calculated) : 73 Heparin Dosing Weight: TBW  Vital Signs: Temp: 98.8 F (37.1 C) (04/02 1740) Temp Source: Oral (04/02 1740) BP: 127/97 (04/02 2130) Pulse Rate: 149 (04/02 2130)  Labs: Recent Labs    05/24/19 1511 05/24/19 1739  HGB 15.1  --   HCT 44.7  --   PLT 229  --   CREATININE 1.17  --   TROPONINIHS 5 6    Estimated Creatinine Clearance: 61.5 mL/min (by C-G formula based on SCr of 1.17 mg/dL).   Medical History: Past Medical History:  Diagnosis Date  . Atrial fibrillation ([place])   . Tachycardia    Assessment: 52 YOM presenting with CP, hx asymptomatic Aflutter not on anticoagulation PTA.  CBC wnl.  Possible DCCV  Goal of Therapy:  Heparin level 0.3-0.7 units/ml Monitor platelets by anticoagulation protocol: Yes   Plan:  Heparin 4000 units IV x 1, and gtt at 1100 units/hr F/u 6 hour heparin level  Bertis Ruddy, PharmD Clinical Pharmacist ED Pharmacist Phone # [phone] 05/24/2019 10:16 PM

## 2019-05-24 NOTE — H&P (Signed)
Cardiology Admission History and Physical:   Patient ID: Bradley Mcbride MRN: [id number]; DOB: 11/16/1950   Admission date: 05/24/2019  Primary Care Provider: Gaynelle Arabian, MD Primary Cardiologist: No primary care provider on file.  Primary Electrophysiologist:  None   Chief Complaint:  Chest pain  Patient Profile:   Bradley Mcbride is a 69 y.o. male smoker with a history of atypical aflutter s/p DCCV (2019) who presents with 12 hours of substernal chest discomfort who converted from NSR to 2:1 Aflutter while in the ED.   History of Present Illness:   Bradley Mcbride was in his usual state of health - smoking 5 cigarettes / day, walking daily with his wife without typical or atypical anginal symptoms - until the night before presentation. He awoke at 2AM with a left sided chest discomfort that was burning in quality. He initially tried to take some Maalox and Tums, but found no relief. Pain was 8/10 at its worst, did not radiate, and was not associated with diaphoresis, nausea, vomiting, or dizziness. Not positional, but he did note increased discomfort with deep inspiration. Of note, patient did receive his first COVID-19 vaccine yesterday.   He presented to the ER around 2PM (12 hours after the onset of pain). Initial EKG SR with PACs and no changes concerning for ischemia. Initial labs with hsTn 5 -> 6. D-dimer 0.69. He was given a GI cocktail and reported some relief. While in the ER, after standing to use the urinal, he converted from NSR to aflutter with variable block and Hrs and high as the 190s. HE was given 10mg  IV diltiazem with transient improvement in HR to 110s-120s, but rates rapidly returned to the 140s. CT PE protocol was ordered (given pleuritic nature of pain and known PFO), a heparin drip was started, and 500 cc IV fluid bolus given.   To be admitted for cardiology for further work up.   Past Medical History:  Diagnosis Date  . Atrial fibrillation ([place])   .  Tachycardia     Past Surgical History:  Procedure Laterality Date  . CARDIOVERSION N/A 10/06/2017   Procedure: CARDIOVERSION;  Surgeon: Fay Records, MD;  Location: [place];  Service: Cardiovascular;  Laterality: N/A;  . ORBITAL FRACTURE SURGERY    . TEE WITHOUT CARDIOVERSION N/A 10/06/2017   Procedure: TRANSESOPHAGEAL ECHOCARDIOGRAM (TEE);  Surgeon: Fay Records, MD;  Location: [facility]COPY;  Service: Cardiovascular;  Laterality: N/A;  . UMBILICAL HERNIA REPAIR       Medications Prior to Admission: Prior to Admission medications   Medication Sig Start Date End Date Taking? Authorizing Provider  Coenzyme Q10 (COQ10 PO) Take 1 capsule by mouth at bedtime.   Yes [provider]  ezetimibe-simvastatin (VYTORIN) 10-20 MG tablet Take 1 tablet by mouth at bedtime.    Yes [provider]  glucosamine-chondroitin 500-400 MG tablet Take 1 tablet by mouth 2 (two) times daily.   Yes [provider]  ibuprofen (ADVIL) 200 MG tablet Take 400 mg by mouth at bedtime.   Yes [provider]  metoprolol (TOPROL-XL) 200 MG 24 hr tablet Take 200 mg by mouth 3 times/day as needed-between meals & bedtime.    Yes [provider]  Multiple Vitamin (MULTIVITAMIN WITH MINERALS) TABS tablet Take 1 tablet by mouth daily.   Yes [provider]  Omega-3 Fatty Acids (FISH OIL) 1000 MG CAPS Take 2,000 mg by mouth 2 (two) times daily.   Yes [provider]     Allergies:  No Known Allergies  Social History:   Social History   Socioeconomic History  . Marital status: Married    Spouse name: Not on file  . Number of children: Not on file  . Years of education: Not on file  . Highest education level: Not on file  Occupational History  . Not on file  Tobacco Use  . Smoking status: Current Every Day Smoker  . Smokeless tobacco: Never Used  Substance and Sexual Activity  . Alcohol use: Yes    Comment: occasional  . Drug use: Never  . Sexual  activity: Not on file  Other Topics Concern  . Not on file  Social History Narrative  . Not on file   Social Determinants of Health   Financial Resource Strain:   . Difficulty of Paying Living Expenses:   Food Insecurity:   . Worried About Charity fundraiser in the Last Year:   . Arboriculturist in the Last Year:   Transportation Needs:   . Film/video editor (Medical):   Marland Kitchen Lack of Transportation (Non-Medical):   Physical Activity:   . Days of Exercise per Week:   . Minutes of Exercise per Session:   Stress:   . Feeling of Stress :   Social Connections:   . Frequency of Communication with Friends and Family:   . Frequency of Social Gatherings with Friends and Family:   . Attends Religious Services:   . Active Member of Clubs or Organizations:   . Attends Archivist Meetings:   Marland Kitchen Marital Status:   Intimate Partner Violence:   . Fear of Current or Ex-Partner:   . Emotionally Abused:   Marland Kitchen Physically Abused:   . Sexually Abused:     Family History:   The patient's family history includes Heart disease in his mother.    ROS:  Please see the history of present illness.  All other ROS reviewed and negative.     Physical Exam/Data:   Vitals:   05/24/19 2030 05/24/19 2035 05/24/19 2040 05/24/19 2045  BP: 126/83 (!) 145/97 (!) 112/98 97/77  Pulse: (!) 142 (!) 153 (!) 148 (!) 136  Resp: 19 (!) 21 (!) 27 18  Temp:      TempSrc:      SpO2: 97% 97% 96% 97%  Weight:      Height:       No intake or output data in the 24 hours ending 05/24/19 2119 Last 3 Weights 05/24/2019 12/26/2017 10/26/2017  Weight (lbs) 169 lb 12.1 oz 170 lb 169 lb  Weight (kg) 77 kg 77.111 kg 76.658 kg     Body mass index is 24.36 kg/m.  General:  Well nourished, well developed. Uncomfortable appearing.  Neck: no JVD Vascular: No carotid bruits; FA pulses 2+ bilaterally without bruits  Cardiac:  Irregularly irregular. Tachycardic. No m/r/g. Lungs:  clear to auscultation bilaterally, no  wheezing, rhonchi or rales  Abd: soft, nontender, no hepatomegaly  Ext: no edema. Warm and well perfused.  Musculoskeletal:  No deformities, BUE and BLE strength normal and equal. Skin: warm and dry. Neuro:  CNs 2-12 intact, no focal abnormalities noted. Psych:  Normal affect.  EKG:  The ECG that was done was personally reviewed and demonstrates 2:1 aflutter  Relevant CV Studies: TEE 09/2017: Left ventricle: LVEF is nromal.   -------------------------------------------------------------------  Aortic valve: AV is normal No AI.   -------------------------------------------------------------------  Aorta: Aorta is normal   -------------------------------------------------------------------  Mitral valve: MV is mildly  thickened. Trace MR.   -------------------------------------------------------------------  Left atrium:  No evidence of thrombus in the atrial cavity or  appendage. No evidence of thrombus in the atrial cavity or  appendage.   -------------------------------------------------------------------  Atrial septum: Moderate to large PFO present as tested by color  doppler and with injeciton of agitated saline with bubbles seen in  left sided chambers   -------------------------------------------------------------------  Right ventricle: RVEF is normal   -------------------------------------------------------------------  Pulmonic valve:  PV is normal .   -------------------------------------------------------------------  Tricuspid valve: TV is normal Trace TR.    -------------------------------------------------------------------  Post procedure conclusions  Ascending Aorta:   - Aorta is normal   Laboratory Data:  High Sensitivity Troponin:   Recent Labs  Lab 05/24/19 1511 05/24/19 1739  TROPONINIHS 5 6      Chemistry Recent Labs  Lab 05/24/19 1511  NA 138  K 4.2  CL 103  CO2 25  GLUCOSE 107*  BUN 16  CREATININE 1.17  CALCIUM 10.0    GFRNONAA >60  GFRAA >60  ANIONGAP 10    Recent Labs  Lab 05/24/19 1630  PROT 6.8  ALBUMIN 3.8  AST 25  ALT 19  ALKPHOS 49  BILITOT 1.7*   Hematology Recent Labs  Lab 05/24/19 1511  WBC 10.4  RBC 4.57  HGB 15.1  HCT 44.7  MCV 97.8  MCH 33.0  MCHC 33.8  RDW 12.9  PLT 229   BNPNo results for input(s): BNP, PROBNP in the last 168 hours.  DDimer  Recent Labs  Lab 05/24/19 1922  DDIMER 0.69*     Radiology/Studies:  DG Chest 2 View  Result Date: 05/24/2019 CLINICAL DATA:  Left-sided chest pain and indigestion EXAM: CHEST - 2 VIEW COMPARISON:  10/05/2017 FINDINGS: The heart size and mediastinal contours are within normal limits. Both lungs are clear. The visualized skeletal structures are unremarkable. IMPRESSION: No active cardiopulmonary disease. Electronically Signed   By: Randa Ngo M.D.   On: 05/24/2019 17:31       HEAR Score (for undifferentiated chest pain):  HEAR Score: 5    Assessment and Plan:   Bradley Mcbride is a 69 year old man who presents with new onset SS, burning chest discomfort. His description of the pain, its pleuritic nature, and its response to a GI cocktail in the setting of hsTn 5 -> 6 is reassuring and I do not necessarily suspect that this pain in cardiac in nature. D-dimer was slightly elevated and CTA currently pending.   While in the ER, patient converted from NSR -> aflutter with variable block. He responded with an improvement in HR after an initial dose of diltiazem, but has returned to 2:1 AF around 140. He has been started on a diltiazem drip.   We will plan admission for additional chest pain work up as well as for rate control of Aflutter.   1. Atrial Flutter with Variable Block -- Patient previously asymptomatic with AF (2019) but does not feel that he has had any episodes since.  -- On diltiazem gtt now and will plan to convert to PO diltiazem while inpatient -- Heparin gtt given CHADS-2-VASc 1-2 and possible TEE/DCCV. Will  transition to Eliquis once any possibility of additional invasive ischemic evaluation ruled out.  -- Was on 200mg  XL metoprolol nightly; will hold while on CCB as above.  -- Complete TTE -- Check TSH.   2. Chest Discomfort -- Based upon description do not suspect atypical angina, but is a current smoker with family history of  heart disease and thus elevated risk.  -- EKG not classic for pericarditis, but will check inflammatory markers.  -- CT PE protocol negative.  -- hsTn negative x 2 -- EKG in NSR with no ischemic changes.  -- Responding to GI cocktail.  -- Complete TTE as above  3. Elevated Bilirubin -- Liver ultrasound while in house.   4. Ketonuria -- Check BHB and consider repeat UA   Severity of Illness: The appropriate patient status for this patient is INPATIENT. Inpatient status is judged to be reasonable and necessary in order to provide the required intensity of service to ensure the patient's safety. The patient's presenting symptoms, physical exam findings, and initial radiographic and laboratory data in the context of their chronic comorbidities is felt to place them at high risk for further clinical deterioration. Furthermore, it is not anticipated that the patient will be medically stable for discharge from the hospital within 2 midnights of admission. The following factors support the patient status of inpatient.   " The patient's presenting symptoms include chest pain. . " The worrisome physical exam findings include n/a " The initial radiographic and laboratory data are worrisome because of abnormal EKG.  " The chronic co-morbidities include HTN, smoking.    * I certify that at the point of admission it is my clinical judgment that the patient will require inpatient hospital care spanning beyond 2 midnights from the point of admission due to high intensity of service, high risk for further deterioration and high frequency of surveillance required.*    For  questions or updates, please contact [place] Please consult [email or web address] for contact info under    Signed, Milus Banister, MD  05/24/2019 9:19 PM

## 2019-05-25 ENCOUNTER — Inpatient Hospital Stay (HOSPITAL_COMMUNITY): Payer: Medicare Other

## 2019-05-25 DIAGNOSIS — I4891 Unspecified atrial fibrillation: Principal | ICD-10-CM

## 2019-05-25 DIAGNOSIS — I4892 Unspecified atrial flutter: Secondary | ICD-10-CM

## 2019-05-25 DIAGNOSIS — R079 Chest pain, unspecified: Secondary | ICD-10-CM

## 2019-05-25 LAB — BASIC METABOLIC PANEL
Anion gap: 11 (ref 5–15)
BUN: 14 mg/dL (ref 8–23)
CO2: 24 mmol/L (ref 22–32)
Calcium: 9 mg/dL (ref 8.9–10.3)
Chloride: 104 mmol/L (ref 98–111)
Creatinine, Ser: 1.1 mg/dL (ref 0.61–1.24)
GFR calc Af Amer: >60 mL/min (ref >60)
GFR calc non Af Amer: >60 mL/min (ref >60)
Glucose, Bld: 109 mg/dL — ABNORMAL HIGH (ref 70–99)
Potassium: 3.7 mmol/L (ref 3.5–5.1)
Sodium: 139 mmol/L (ref 135–145)

## 2019-05-25 LAB — LIPID PANEL
Cholesterol: 126 mg/dL (ref 0–200)
HDL: 76 mg/dL (ref >40)
LDL Cholesterol: 43 mg/dL (ref 0–99)
Total CHOL/HDL Ratio: 1.7 RATIO
Triglycerides: 37 mg/dL (ref <150)
VLDL: 7 mg/dL (ref 0–40)

## 2019-05-25 LAB — URINALYSIS, ROUTINE W REFLEX MICROSCOPIC
Bilirubin Urine: NEGATIVE
Glucose, UA: NEGATIVE mg/dL
Hgb urine dipstick: NEGATIVE
Ketones, ur: 5 mg/dL — AB
Leukocytes,Ua: NEGATIVE
Nitrite: NEGATIVE
Protein, ur: NEGATIVE mg/dL
Specific Gravity, Urine: 1.012 (ref 1.005–1.030)
pH: 6 (ref 5.0–8.0)

## 2019-05-25 LAB — SARS CORONAVIRUS 2 (TAT 6-24 HRS): SARS Coronavirus 2: NEGATIVE

## 2019-05-25 LAB — CBC
HCT: 42.2 % (ref 39.0–52.0)
Hemoglobin: 14.4 g/dL (ref 13.0–17.0)
MCH: 33 pg (ref 26.0–34.0)
MCHC: 34.1 g/dL (ref 30.0–36.0)
MCV: 96.8 fL (ref 80.0–100.0)
Platelets: 211 10*3/uL (ref 150–400)
RBC: 4.36 MIL/uL (ref 4.22–5.81)
RDW: 12.8 % (ref 11.5–15.5)
WBC: 8.7 10*3/uL (ref 4.0–10.5)
nRBC: 0 % (ref 0.0–0.2)

## 2019-05-25 LAB — ECHOCARDIOGRAM COMPLETE
Height: 70 in
Weight: 2590.4 oz

## 2019-05-25 LAB — HEPARIN LEVEL (UNFRACTIONATED)
Heparin Unfractionated: 0.51 IU/mL (ref 0.30–0.70)
Heparin Unfractionated: 0.6 IU/mL (ref 0.30–0.70)

## 2019-05-25 LAB — SEDIMENTATION RATE: Sed Rate: 7 mm/hr (ref 0–16)

## 2019-05-25 LAB — BETA-HYDROXYBUTYRIC ACID: Beta-Hydroxybutyric Acid: 0.84 mmol/L — ABNORMAL HIGH (ref 0.05–0.27)

## 2019-05-25 LAB — HIV ANTIBODY (ROUTINE TESTING W REFLEX): HIV Screen 4th Generation wRfx: NONREACTIVE

## 2019-05-25 LAB — TSH: TSH: 4.011 u[IU]/mL (ref 0.350–4.500)

## 2019-05-25 MED ORDER — ATORVASTATIN CALCIUM 10 MG PO TABS
10.0000 mg | ORAL_TABLET | Freq: Every day | ORAL | Status: DC
  Administered 2019-05-25: 19:00:00 10 mg via ORAL
  Filled 2019-05-25: qty 1

## 2019-05-25 MED ORDER — FLECAINIDE ACETATE 50 MG PO TABS
300.0000 mg | ORAL_TABLET | Freq: Once | ORAL | Status: AC
  Administered 2019-05-25: 300 mg via ORAL
  Filled 2019-05-25: qty 6

## 2019-05-25 MED ORDER — DILTIAZEM HCL ER 90 MG PO CP12
90.0000 mg | ORAL_CAPSULE | Freq: Two times a day (BID) | ORAL | Status: DC
  Administered 2019-05-25 (×2): 90 mg via ORAL
  Filled 2019-05-25 (×3): qty 1

## 2019-05-25 NOTE — Progress Notes (Signed)
Echocardiogram 2D Echocardiogram has been performed.  Oneal Deputy Aribella Vavra 05/25/2019, 10:25 AM

## 2019-05-25 NOTE — Progress Notes (Addendum)
[place] for heparin Indication: atrial fibrillation  Patient Measurements: Height: 5\' 10"  (177.8 cm) Weight: 73.4 kg (161 lb 14.4 oz) IBW/kg (Calculated) : 73   Vital Signs: Temp: 98.5 F (36.9 C) (04/03 0540) Temp Source: Oral (04/03 0540) BP: 96/63 (04/03 0540) Pulse Rate: 77 (04/03 0540)  Labs: Recent Labs    05/24/19 1511 05/24/19 1739 05/25/19 0418  HGB 15.1  --  14.4  HCT 44.7  --  42.2  PLT 229  --  211  HEPARINUNFRC  --   --  0.51  CREATININE 1.17  --   --   TROPONINIHS 5 6  --      Medical History: Past Medical History:  Diagnosis Date  . Atrial fibrillation ([place])   . Tachycardia    Assessment: 69 year old male presenting with CP, hx asymptomatic Aflutter not on anticoagulation PTA. On heparin infusion - therapeutic. H/h plts wnl.  Goal of Therapy:  Heparin level 0.3-0.7 units/ml Monitor platelets by anticoagulation protocol: Yes   Plan:  -Heparin infusion at 1100 units/hr -Daily HL, CBC   Pooja Camuso, Jake Church 05/25/2019 8:18 AM   Addendum -Confirmatory level is therapeutic -Continue heparin at 1100 units/hr -Daily HL, CBC   Harvel Quale 05/25/2019 1:46 PM

## 2019-05-25 NOTE — Consult Note (Addendum)
Cardiology Consultation:   Patient ID: [name] MRN: [id number]; DOB: 1950-03-27  Admit date: 05/24/2019 Date of Consult: 05/25/2019  Primary Care Provider: Gaynelle Arabian, MD Primary Cardiologist: Cristopher Peru, MD  Primary Electrophysiologist:  None    Patient Profile:   Bradley Mcbride is a 69 y.o. male with a hx of atypical atrial flutter who is being seen today for the evaluation of atrial flutter at the request of Georgiann Mccoy.  History of Present Illness:   Mr. Asis has a history of atrial fibrillation and atypical atrial flutter.  He presented to the hospital after being awoken with left-sided chest discomfort which was burning in quality.  He did receive his first Covid vaccination the day of his presentation.  He tried Tums and Maalox without much relief.  There was a increase in his discomfort with deep inspiration.  In the emergency room, he went into atrial flutter.  He was put on a diltiazem drip and a heparin drip.  He is not on chronic anticoagulation.  He does have a history of atypical atrial flutter and has had cardioversion in the past.  This morning, he feels well.  He has no chest pain or shortness of breath.  He is not on oxygen.  He did get out of bed with increasing of his heart rate to 150 beats a minute.  When he got back in bed, he was found to have low blood pressures into the 70s.  He says that he feels well without major complaints today.   Past Medical History:  Diagnosis Date  . Atrial fibrillation ([place])   . Tachycardia     Past Surgical History:  Procedure Laterality Date  . CARDIOVERSION N/A 10/06/2017   Procedure: CARDIOVERSION;  Surgeon: Fay Records, MD;  Location: [place];  Service: Cardiovascular;  Laterality: N/A;  . ORBITAL FRACTURE SURGERY    . TEE WITHOUT CARDIOVERSION N/A 10/06/2017   Procedure: TRANSESOPHAGEAL ECHOCARDIOGRAM (TEE);  Surgeon: Fay Records, MD;  Location: [place];  Service: Cardiovascular;   Laterality: N/A;  . UMBILICAL HERNIA REPAIR       Home Medications:  Prior to Admission medications   Medication Sig Start Date End Date Taking? Authorizing Provider  Coenzyme Q10 (COQ10 PO) Take 1 capsule by mouth at bedtime.   Yes [provider]  ezetimibe-simvastatin (VYTORIN) 10-20 MG tablet Take 1 tablet by mouth at bedtime.    Yes [provider]  glucosamine-chondroitin 500-400 MG tablet Take 1 tablet by mouth 2 (two) times daily.   Yes [provider]  ibuprofen (ADVIL) 200 MG tablet Take 400 mg by mouth at bedtime.   Yes [provider]  metoprolol (TOPROL-XL) 200 MG 24 hr tablet Take 200 mg by mouth 3 times/day as needed-between meals & bedtime.    Yes [provider]  Multiple Vitamin (MULTIVITAMIN WITH MINERALS) TABS tablet Take 1 tablet by mouth daily.   Yes [provider]  Omega-3 Fatty Acids (FISH OIL) 1000 MG CAPS Take 2,000 mg by mouth 2 (two) times daily.   Yes [provider]    Inpatient Medications: Scheduled Meds: . ezetimibe  10 mg Oral QHS   And  . simvastatin  20 mg Oral QHS  . multivitamin with minerals  1 tablet Oral Daily  . pantoprazole  40 mg Oral BID   Continuous Infusions: . diltiazem (CARDIZEM) infusion 7.5 mg/hr (05/25/19 0326)  . heparin 1,100 Units/hr (05/25/19 0011)   PRN Meds: acetaminophen  Allergies:  No Known Allergies  Social History:   Social History   Socioeconomic History  . Marital status: Married    Spouse name: Not on file  . Number of children: Not on file  . Years of education: Not on file  . Highest education level: Not on file  Occupational History  . Not on file  Tobacco Use  . Smoking status: Current Every Day Smoker  . Smokeless tobacco: Never Used  Substance and Sexual Activity  . Alcohol use: Yes    Comment: occasional  . Drug use: Never  . Sexual activity: Not on file  Other Topics Concern  . Not on file  Social History Narrative  . Not on  file   Social Determinants of Health   Financial Resource Strain:   . Difficulty of Paying Living Expenses:   Food Insecurity:   . Worried About Charity fundraiser in the Last Year:   . Arboriculturist in the Last Year:   Transportation Needs:   . Film/video editor (Medical):   Marland Kitchen Lack of Transportation (Non-Medical):   Physical Activity:   . Days of Exercise per Week:   . Minutes of Exercise per Session:   Stress:   . Feeling of Stress :   Social Connections:   . Frequency of Communication with Friends and Family:   . Frequency of Social Gatherings with Friends and Family:   . Attends Religious Services:   . Active Member of Clubs or Organizations:   . Attends Archivist Meetings:   Marland Kitchen Marital Status:   Intimate Partner Violence:   . Fear of Current or Ex-Partner:   . Emotionally Abused:   Marland Kitchen Physically Abused:   . Sexually Abused:     Family History:    Family History  Problem Relation Age of Onset  . Heart disease Mother      ROS:  Please see the history of present illness.   All other ROS reviewed and negative.     Physical Exam/Data:   Vitals:   05/25/19 0057 05/25/19 0151 05/25/19 0252 05/25/19 0540  BP: 92/71 96/69 109/63 96/63  Pulse: 75 76 68 77  Resp:  20  18  Temp:    98.5 F (36.9 C)  TempSrc:    Oral  SpO2: 95% 95% 95% 97%  Weight:    73.4 kg  Height:        Intake/Output Summary (Last 24 hours) at 05/25/2019 0903 Last data filed at 05/25/2019 0500 Gross per 24 hour  Intake 500 ml  Output 200 ml  Net 300 ml   Last 3 Weights 05/25/2019 05/24/2019 05/24/2019  Weight (lbs) 161 lb 14.4 oz 161 lb 12.8 oz 169 lb 12.1 oz  Weight (kg) 73.437 kg 73.392 kg 77 kg     Body mass index is 23.23 kg/m.  General:  Well nourished, well developed, in no acute distress HEENT: normal Lymph: no adenopathy Neck: no JVD Endocrine:  No thryomegaly Vascular: No carotid bruits; FA pulses 2+ bilaterally without bruits  Cardiac: Irregular; RRR; no murmur   Lungs:  clear to auscultation bilaterally, no wheezing, rhonchi or rales  Abd: soft, nontender, no hepatomegaly  Ext: no edema Musculoskeletal:  No deformities, BUE and BLE strength normal and equal Skin: warm and dry  Neuro:  CNs 2-12 intact, no focal abnormalities noted Psych:  Normal affect   EKG:  The EKG was personally reviewed and demonstrates: Atrial fibrillation and atrial flutter Telemetry:  Telemetry was personally  reviewed and demonstrates: Atrial fibrillation  Relevant CV Studies: Echo pending  Laboratory Data:  High Sensitivity Troponin:   Recent Labs  Lab 05/24/19 1511 05/24/19 1739  TROPONINIHS 5 6     Chemistry Recent Labs  Lab 05/24/19 1511 05/25/19 0742  NA 138 139  K 4.2 3.7  CL 103 104  CO2 25 24  GLUCOSE 107* 109*  BUN 16 14  CREATININE 1.17 1.10  CALCIUM 10.0 9.0  GFRNONAA >60 >60  GFRAA >60 >60  ANIONGAP 10 11    Recent Labs  Lab 05/24/19 1630  PROT 6.8  ALBUMIN 3.8  AST 25  ALT 19  ALKPHOS 49  BILITOT 1.7*   Hematology Recent Labs  Lab 05/24/19 1511 05/25/19 0418  WBC 10.4 8.7  RBC 4.57 4.36  HGB 15.1 14.4  HCT 44.7 42.2  MCV 97.8 96.8  MCH 33.0 33.0  MCHC 33.8 34.1  RDW 12.9 12.8  PLT 229 211   BNPNo results for input(s): BNP, PROBNP in the last 168 hours.  DDimer  Recent Labs  Lab 05/24/19 1922  DDIMER 0.69*     Radiology/Studies:  DG Chest 2 View  Result Date: 05/24/2019 CLINICAL DATA:  Left-sided chest pain and indigestion EXAM: CHEST - 2 VIEW COMPARISON:  10/05/2017 FINDINGS: The heart size and mediastinal contours are within normal limits. Both lungs are clear. The visualized skeletal structures are unremarkable. IMPRESSION: No active cardiopulmonary disease. Electronically Signed   By: Randa Ngo M.D.   On: 05/24/2019 17:31   CT Angio Chest PE W/Cm &/Or Wo Cm  Result Date: 05/24/2019 CLINICAL DATA:  Chest pain for several hours EXAM: CT ANGIOGRAPHY CHEST WITH CONTRAST TECHNIQUE: Multidetector CT  imaging of the chest was performed using the standard protocol during bolus administration of intravenous contrast. Multiplanar CT image reconstructions and MIPs were obtained to evaluate the vascular anatomy. CONTRAST:  151mL OMNIPAQUE IOHEXOL 350 MG/ML SOLN COMPARISON:  Chest x-ray from earlier in the same day. FINDINGS: Cardiovascular: Thoracic aorta demonstrates mild atherosclerotic calcifications. No aneurysmal dilatation is seen. The degree of opacification is limited. No cardiac enlargement is noted. The pulmonary artery shows a normal branching pattern bilaterally. No focal filling defect to suggest pulmonary embolism is identified. Scattered coronary calcifications are noted. Mediastinum/Nodes: Thoracic inlet is within normal limits. No hilar or mediastinal adenopathy is noted. The esophagus as visualized is within normal limits. Lungs/Pleura: Lungs are well aerated bilaterally. Minimal dependent atelectatic changes are seen. No focal infiltrate or sizable effusion is noted. No parenchymal nodules are seen. Upper Abdomen: Visualized upper abdomen is unremarkable. Musculoskeletal: Degenerative changes of the thoracic spine are seen. Review of the MIP images confirms the above findings. IMPRESSION: No evidence of pulmonary emboli. Mild bibasilar atelectatic changes. Aortic Atherosclerosis (ICD10-I70.0). Electronically Signed   By: Inez Catalina M.D.   On: 05/24/2019 21:59      Assessment and Plan:   1. Atrial fibrillation/atrial flutter: Patient is currently on a diltiazem drip.  We Kendra Woolford adjust his medications to oral today.  He is also on a heparin drip.  He Ahmira Boisselle likely be discharged on Eliquis.  He did have some low blood pressures.  Echo is pending.  As he went into atrial flutter and received prompt anticoagulation, we can plan for a rhythm control strategy.  He has scattered coronary calcifications and no mention on echo of reduced ejection fraction.  We Lenola Lockner try flecainide 300 mg to see if he Thom Ollinger  convert.  If he does, Geddy Boydstun likely be discharged on low-dose  flecainide. 2. Chest pain: High-sensitivity troponin negative x2.  CT PE protocol negative.  Not obviously classic for endocarditis.  Responded to GI cocktail.  No further chest pain work-up at this time. 3. Elevated bilirubin: Plan for liver ultrasound today.  If it is unrevealing, Hamad Whyte plan for outpatient follow-up. 4. Ketonuria: We Caelin Rosen plan for repeat urinalysis.  Blood glucose without major abnormality.  Zamauri Nez likely require outpatient work-up.   For questions or updates, please contact [place] Please consult [email or web address] for contact info under     Signed, Kenniel Bergsma Meredith Leeds, MD  05/25/2019 9:03 AM

## 2019-05-25 NOTE — Progress Notes (Signed)
Pts heart rate up to 150s when getting out of bed to put on pants, returns to 120s when back in bed, MD notified.

## 2019-05-26 ENCOUNTER — Encounter (HOSPITAL_COMMUNITY): Payer: Self-pay | Admitting: Cardiology

## 2019-05-26 DIAGNOSIS — R17 Unspecified jaundice: Secondary | ICD-10-CM

## 2019-05-26 DIAGNOSIS — Z7901 Long term (current) use of anticoagulants: Secondary | ICD-10-CM

## 2019-05-26 DIAGNOSIS — R000 Tachycardia, unspecified: Secondary | ICD-10-CM | POA: Diagnosis present

## 2019-05-26 LAB — CBC
HCT: 40.7 % (ref 39.0–52.0)
Hemoglobin: 13.9 g/dL (ref 13.0–17.0)
MCH: 33.1 pg (ref 26.0–34.0)
MCHC: 34.2 g/dL (ref 30.0–36.0)
MCV: 96.9 fL (ref 80.0–100.0)
Platelets: 216 10*3/uL (ref 150–400)
RBC: 4.2 MIL/uL — ABNORMAL LOW (ref 4.22–5.81)
RDW: 12.7 % (ref 11.5–15.5)
WBC: 8.6 10*3/uL (ref 4.0–10.5)
nRBC: 0 % (ref 0.0–0.2)

## 2019-05-26 LAB — HEPARIN LEVEL (UNFRACTIONATED)
Heparin Unfractionated: 1.08 IU/mL — ABNORMAL HIGH (ref 0.30–0.70)
Heparin Unfractionated: 1.08 IU/mL — ABNORMAL HIGH (ref 0.30–0.70)

## 2019-05-26 MED ORDER — DILTIAZEM HCL ER COATED BEADS 240 MG PO CP24: 240.0000 mg | ORAL_CAPSULE | Freq: Every day | ORAL | 3 refills | Status: DC

## 2019-05-26 MED ORDER — EZETIMIBE 10 MG PO TABS: 10.0000 mg | ORAL_TABLET | Freq: Every day | ORAL | 3 refills | Status: DC

## 2019-05-26 MED ORDER — PRAVASTATIN SODIUM 40 MG PO TABS: 40.0000 mg | ORAL_TABLET | Freq: Every evening | ORAL | 3 refills | Status: AC

## 2019-05-26 MED ORDER — APIXABAN 5 MG PO TABS
5.0000 mg | ORAL_TABLET | Freq: Two times a day (BID) | ORAL | Status: DC
  Administered 2019-05-26: 5 mg via ORAL
  Filled 2019-05-26: qty 1

## 2019-05-26 MED ORDER — DILTIAZEM HCL ER COATED BEADS 240 MG PO CP24
240.0000 mg | ORAL_CAPSULE | Freq: Every day | ORAL | Status: DC
  Administered 2019-05-26: 09:00:00 240 mg via ORAL
  Filled 2019-05-26: qty 1

## 2019-05-26 MED ORDER — APIXABAN 5 MG PO TABS: 5.0000 mg | ORAL_TABLET | Freq: Two times a day (BID) | ORAL | 3 refills | Status: AC

## 2019-05-26 MED ORDER — ATORVASTATIN CALCIUM 10 MG PO TABS: 10.0000 mg | ORAL_TABLET | Freq: Every day | ORAL | 3 refills | Status: DC

## 2019-05-26 NOTE — Discharge Instructions (Addendum)
Information on my medicine - ELIQUIS (apixaban)  This medication education was reviewed with me or my healthcare representative as part of my discharge preparation.  The pharmacist that spoke with me during my hospital stay was:  Ronna Polio, [facility]  Why was Eliquis prescribed for you? Eliquis was prescribed for you to reduce the risk of forming blood clots that can cause a stroke if you have a medical condition called atrial fibrillation/flutter (a type of irregular heartbeat).  What do You need to know about Eliquis ? Take your Eliquis TWICE DAILY - one tablet in the morning and one tablet in the evening with or without food.  It would be best to take the doses about the same time each day.  If you have difficulty swallowing the tablet whole please discuss with your pharmacist how to take the medication safely.  Take Eliquis exactly as prescribed by your doctor and DO NOT stop taking Eliquis without talking to the doctor who prescribed the medication.  Stopping may increase your risk of developing a new clot or stroke.  Refill your prescription before you run out.  After discharge, you should have regular check-up appointments with your healthcare provider that is prescribing your Eliquis.  In the future your dose may need to be changed if your kidney function or weight changes by a significant amount or as you get older.  What do you do if you miss a dose? If you miss a dose, take it as soon as you remember on the same day and resume taking twice daily.  Do not take more than one dose of ELIQUIS at the same time.  Important Safety Information A possible side effect of Eliquis is bleeding. You should call your healthcare provider right away if you experience any of the following: ? Bleeding from an injury or your nose that does not stop. ? Unusual colored urine (red or dark brown) or unusual colored stools (red or black). ? Unusual bruising for unknown reasons. ? A serious fall  or if you hit your head (even if there is no bleeding).  Some medicines may interact with Eliquis and might increase your risk of bleeding or clotting while on Eliquis. To help avoid this, consult your healthcare provider or pharmacist prior to using any new prescription or non-prescription medications, including herbals, vitamins, non-steroidal anti-inflammatory drugs (NSAIDs) and supplements.  This website has more information on Eliquis (apixaban): [email or web address].

## 2019-05-26 NOTE — Discharge Summary (Addendum)
Discharge Summary    Patient ID: Bradley Bradley Mcbride MRN: [id number]; DOB: [birth year]  Admit date: 05/24/2019 Discharge date: 05/26/2019  Primary Care Provider: Gaynelle Arabian, MD  Primary Cardiologist: Cristopher Peru, MD  Primary Electrophysiologist:  None   Discharge Diagnoses    Principal Problem:   Atrial fibrillation/flutter Bradley Bradley Mcbride) Active Problems:   Atrial flutter ([place])   Elevated bilirubin   Chronic anticoagulation   Tachycardia    Diagnostic Studies/Procedures    TTE 05/25/19  1. Left ventricular ejection fraction, by estimation, is 60 to 65%. The  left ventricle has normal function. The left ventricle has no regional  wall motion abnormalities. There is moderate left ventricular hypertrophy.  Left ventricular diastolic function   could not be evaluated.   2. Right ventricular systolic function is normal. The right ventricular  size is normal. There is normal pulmonary artery systolic pressure.   3. The mitral valve is grossly normal. Trivial mitral valve  regurgitation.   4. The aortic valve is tricuspid. Aortic valve regurgitation is not  visualized.   5. Aortic dilatation noted. There is moderate dilatation at the level of  the sinuses of Valsalva measuring 42 mm.   6. The inferior vena cava is normal in size with greater than 50%  respiratory variability, suggesting right atrial pressure of 3 mmHg.  _____________   History of Present Illness     Bradley Bradley Mcbride is a 69 y.o. male with a history of atrial flutter presented to the hospital with atrial flutter and atypical chest pain.  Bradley Bradley Mcbride has a history of atrial fibrillation and atypical atrial flutter.  He presented to the hospital after being awoken with left-sided chest discomfort which was burning in quality.  He did receive his first Covid vaccination the day of his presentation.  He tried Tums and Maalox without much relief.  There was a increase in his discomfort with deep inspiration.  In the  emergency room, he went into atrial flutter.  He was put on a diltiazem drip and a heparin drip.  He is not on chronic anticoagulation.  He does have a history of atypical atrial flutter and has had cardioversion in the past.   This morning, he feels well.  He has no chest pain or shortness of breath.  He is not on oxygen.  He did get out of bed with increasing of his heart rate to 150 beats a minute.  When he got back in bed, he was found to have low blood pressures into the 70s.  He says that he feels well without major complaints today.  Hospital Course     Consultants: none  Atrial fibrillation / atrial flutter with RVR He was started on 300 mg flecainide. He did not convert but did have a widening of his QRS. Flecainide was discontinued and he was started on cardizem 240 mg. toprol held.  He converted to NSR. Echocardiogram with normal EF, moderate LVH, and aortic dilation as noted below. Bradley Bradley Mcbride continue 240 mg cardizem and eliquis.    Chest pain CE negative x 2. CTA negative for PE, but did note aortic atherosclerosis.   Elevated bilirubin Ultrasound unrevealingly.    Bradley Bradley Mcbride trending down on repeat UA. Bradley Mcbride up with PCP.    Aortic dilation on echo - 4.2 cm Bradley Bradley Mcbride   Hyperlipidemia Vytorin was D/C'ed as there is an interaction between simvastatin and cardizem. lipitor and zetia were ordered separately. Bradley Bradley Mcbride defer to PCP for further management.   Pt  seen and examined by Dr. Curt Bears and deemed stable for discharge. Bradley Mcbride up Bradley Bradley Mcbride.     Did the patient have an acute coronary syndrome (MI, NSTEMI, STEMI, etc) this admission?:  No                               Did the patient have a percutaneous coronary intervention (stent / angioplasty)?:  No.   _____________  Discharge Vitals Blood pressure 120/78, pulse (!) 122, temperature 97.8 F (36.6 C), temperature source Oral, resp. rate 20, height 5\' 10"  (1.778 m), weight 72.8 kg, SpO2  98 %.  Filed Weights   05/24/19 2307 05/25/19 0540 05/26/19 0643  Weight: 73.4 kg 73.4 kg 72.8 kg    Labs & Radiologic Studies    CBC Recent Labs    05/25/19 0418 05/26/19 0355  WBC 8.7 8.6  HGB 14.4 13.9  HCT 42.2 40.7  MCV 96.8 96.9  PLT 211 [id number]   Basic Metabolic Panel Recent Labs    05/24/19 1511 05/25/19 0742  NA 138 139  K 4.2 3.7  CL 103 104  CO2 25 24  GLUCOSE 107* 109*  BUN 16 14  CREATININE 1.17 1.10  CALCIUM 10.0 9.0   Liver Function Tests Recent Labs    05/24/19 1630  AST 25  ALT 19  ALKPHOS 49  BILITOT 1.7*  PROT 6.8  ALBUMIN 3.8   Recent Labs    05/24/19 1630  LIPASE 29   High Sensitivity Troponin:   Recent Labs  Lab 05/24/19 1511 05/24/19 1739  TROPONINIHS 5 6    BNP Invalid input(s): POCBNP D-Dimer Recent Labs    05/24/19 1922  DDIMER 0.69*   Hemoglobin A1C Recent Labs    05/24/19 2200  HGBA1C 5.8*   Fasting Lipid Panel Recent Labs    05/25/19 0418  CHOL 126  HDL 76  LDLCALC 43  TRIG 37  CHOLHDL 1.7   Thyroid Function Tests Recent Labs    05/24/19 2303  TSH 4.011   _____________  DG Chest 2 View  Result Date: 05/24/2019 CLINICAL DATA:  Left-sided chest pain and indigestion EXAM: CHEST - 2 VIEW COMPARISON:  10/05/2017 FINDINGS: The heart size and mediastinal contours are within normal limits. Both lungs are clear. The visualized skeletal structures are unremarkable. IMPRESSION: No active cardiopulmonary disease. Electronically Signed   By: Randa Ngo M.D.   On: 05/24/2019 17:31   CT Angio Chest PE W/Cm &/Or Wo Cm  Result Date: 05/24/2019 CLINICAL DATA:  Chest pain for several hours EXAM: CT ANGIOGRAPHY CHEST WITH CONTRAST TECHNIQUE: Multidetector CT imaging of the chest was performed using the standard protocol during bolus administration of intravenous contrast. Multiplanar CT image reconstructions and MIPs were obtained to evaluate the vascular anatomy. CONTRAST:  156mL OMNIPAQUE IOHEXOL 350 MG/ML SOLN  COMPARISON:  Chest x-ray from earlier in the same day. FINDINGS: Cardiovascular: Thoracic aorta demonstrates mild atherosclerotic calcifications. No aneurysmal dilatation is seen. The degree of opacification is limited. No cardiac enlargement is noted. The pulmonary artery shows a normal branching pattern bilaterally. No focal filling defect to suggest pulmonary embolism is identified. Scattered coronary calcifications are noted. Mediastinum/Nodes: Thoracic inlet is within normal limits. No hilar or mediastinal adenopathy is noted. The esophagus as visualized is within normal limits. Lungs/Pleura: Lungs are well aerated bilaterally. Minimal dependent atelectatic changes are seen. No focal infiltrate or sizable effusion is noted. No parenchymal nodules are seen. Upper  Abdomen: Visualized upper abdomen is unremarkable. Musculoskeletal: Degenerative changes of the thoracic spine are seen. Review of the MIP images confirms the above findings. IMPRESSION: No evidence of pulmonary emboli. Mild bibasilar atelectatic changes. Aortic Atherosclerosis (ICD10-I70.0). Electronically Signed   By: Inez Catalina M.D.   On: 05/24/2019 21:59   ECHOCARDIOGRAM COMPLETE  Result Date: 05/25/2019    ECHOCARDIOGRAM REPORT   Patient Name:   TALIK PERITO Date of Exam: 05/25/2019 Medical Rec #:  [id number]         Height:       70.0 in Accession #:    [id number]        Weight:       161.9 lb Date of Birth:  Apr 09, 1950         BSA:          1.908 m Patient Age:    34 years          BP:           94/71 mmHg Patient Gender: M                 HR:           100 bpm. Exam Location:  Inpatient Procedure: 2D Echo, Color Doppler and Cardiac Doppler Indications:    R07.9* Chest pain, unspecified  History:        Patient has prior history of Echocardiogram examinations, most                 recent 10/06/2017. Arrythmias:Atrial Fibrillation.  Sonographer:    Raquel Sarna Senior RDCS Referring Phys: [id number] [place]  1. Left ventricular  ejection fraction, by estimation, is 60 to 65%. The left ventricle has normal function. The left ventricle has no regional wall motion abnormalities. There is moderate left ventricular hypertrophy. Left ventricular diastolic function  could not be evaluated.  2. Right ventricular systolic function is normal. The right ventricular size is normal. There is normal pulmonary artery systolic pressure.  3. The mitral valve is grossly normal. Trivial mitral valve regurgitation.  4. The aortic valve is tricuspid. Aortic valve regurgitation is not visualized.  5. Aortic dilatation noted. There is moderate dilatation at the level of the sinuses of Valsalva measuring 42 mm.  6. The inferior vena cava is normal in size with greater than 50% respiratory variability, suggesting right atrial pressure of 3 mmHg. FINDINGS  Left Ventricle: Left ventricular ejection fraction, by estimation, is 60 to 65%. The left ventricle has normal function. The left ventricle has no regional wall motion abnormalities. The left ventricular internal cavity size was normal in size. There is  moderate left ventricular hypertrophy. Left ventricular diastolic function could not be evaluated due to atrial fibrillation. Left ventricular diastolic function could not be evaluated. Right Ventricle: The right ventricular size is normal. No increase in right ventricular wall thickness. Right ventricular systolic function is normal. There is normal pulmonary artery systolic pressure. The tricuspid regurgitant velocity is 2.04 m/s, and  with an assumed right atrial pressure of 3 mmHg, the estimated right ventricular systolic pressure is [id number] mmHg. Left Atrium: Left atrial size was normal in size. Right Atrium: Right atrial size was normal in size. Pericardium: There is no evidence of pericardial effusion. Mitral Valve: The mitral valve is grossly normal. Trivial mitral valve regurgitation. Tricuspid Valve: The tricuspid valve is grossly normal. Tricuspid valve  regurgitation is trivial. Aortic Valve: The aortic valve is tricuspid. Aortic valve regurgitation is not visualized. Pulmonic Valve: The  pulmonic valve was grossly normal. Pulmonic valve regurgitation is not visualized. Aorta: Aortic dilatation noted. There is moderate dilatation at the level of the sinuses of Valsalva measuring 42 mm. Venous: The inferior vena cava is normal in size with greater than 50% respiratory variability, suggesting right atrial pressure of 3 mmHg. IAS/Shunts: The interatrial septum was not well visualized.  LEFT VENTRICLE PLAX 2D LVIDd:         3.40 cm LVIDs:         1.90 cm LV PW:         1.30 cm LV IVS:        1.30 cm LVOT diam:     2.50 cm LV SV:         69 LV SV Index:   36 LVOT Area:     4.91 cm  RIGHT VENTRICLE TAPSE (M-mode): 1.5 cm LEFT ATRIUM             Index       RIGHT ATRIUM           Index LA diam:        1.60 cm 0.84 cm/m  RA Area:     12.80 cm LA Vol (A2C):   41.2 ml 21.59 ml/m RA Volume:   29.10 ml  15.25 ml/m LA Vol (A4C):   23.6 ml 12.37 ml/m LA Biplane Vol: 34.2 ml 17.92 ml/m  AORTIC VALVE LVOT Vmax:   74.20 cm/s LVOT Vmean:  54.000 cm/s LVOT VTI:    0.140 m  AORTA Ao Root diam: 3.40 cm Ao Asc diam:  3.50 cm TRICUSPID VALVE TR Peak grad:   16.6 mmHg TR Vmax:        204.00 cm/s  SHUNTS Systemic VTI:  0.14 m Systemic Diam: 2.50 cm Lyman Bishop MD Electronically signed by Lyman Bishop MD Signature Date/Time: 05/25/2019/12:18:13 PM    Final    US Abdomen Limited RUQ  Result Date: 05/25/2019 CLINICAL DATA:  Hyperbilirubinemia. EXAM: ULTRASOUND ABDOMEN LIMITED RIGHT UPPER QUADRANT COMPARISON:  None. FINDINGS: Gallbladder: No gallstones or wall thickening visualized. No sonographic Murphy sign noted by sonographer. Common bile duct: Diameter: 6 mm Liver: No focal lesion identified. Within normal limits in parenchymal echogenicity. Portal vein is patent on color Doppler imaging with normal direction of blood flow towards the liver. Other: None. IMPRESSION: Normal RIGHT  upper quadrant ultrasound. Electronically Signed   By: Franki Cabot M.D.   On: 05/25/2019 10:21   Disposition   Pt is being discharged home today in good condition.  Bradley Mcbride-up Plans & Appointments    Bradley Mcbride-up Information     Sherran Needs, NP Bradley Mcbride up in 1 week(s).   Specialties: Nurse Practitioner, Cardiology Why: hospital Bradley Mcbride up Contact information: [place] [phone]           Discharge Instructions     Amb referral to AFIB Bradley Mcbride   Complete by: As directed    Diet - low sodium heart healthy   Complete by: As directed    Increase activity slowly   Complete by: As directed        Discharge Medications   Allergies as of 05/26/2019   No Known Allergies      Medication List     STOP taking these medications    ezetimibe-simvastatin 10-20 MG tablet Commonly known as: VYTORIN   ibuprofen 200 MG tablet Commonly known as: ADVIL   metoprolol 200 MG 24 hr tablet Commonly known as: TOPROL-XL  TAKE these medications    apixaban 5 MG Tabs tablet Commonly known as: ELIQUIS Take 1 tablet (5 mg total) by mouth 2 (two) times daily. Notes to patient: Blood thinner Prevents clotting in heart chamber and stroke   atorvastatin 10 MG tablet Commonly known as: LIPITOR Take 1 tablet (10 mg total) by mouth daily at 6 PM.   COQ10 PO Take 1 capsule by mouth at bedtime. Notes to patient: Supplement    diltiazem 240 MG 24 hr capsule Commonly known as: CARDIZEM CD Take 1 capsule (240 mg total) by mouth daily. Start taking on: May 27, 2019   ezetimibe 10 MG tablet Commonly known as: ZETIA Take 1 tablet (10 mg total) by mouth at bedtime.   Fish Oil 1000 MG Caps Take 2,000 mg by mouth 2 (two) times daily.   glucosamine-chondroitin 500-400 MG tablet Take 1 tablet by mouth 2 (two) times daily.   multivitamin with minerals Tabs tablet Take 1 tablet by mouth daily.           Outstanding Labs/Studies    none  Duration of Discharge Encounter   Greater than 30 minutes including physician time.  Signed, [place], PA 05/26/2019, 2:21 PM   I have seen and examined this patient with [place].  Agree with above, note added to reflect my findings.  On exam, RRR, no murmurs, lungs clear. Admitted with atrial fib/flutter. Did not tolerate flecainide 300 mg as QRS widened. Put back on Caredizem for rate control and has since converted to sinus rhythm. Tamia Dial discharge today with Bradley Mcbride up in AF Bradley Mcbride.    Callee Rohrig M. Danea Manter MD 05/26/2019 2:21 PM

## 2019-05-26 NOTE — Progress Notes (Signed)
ANTICOAGULATION CONSULT NOTE  Pharmacy Consult for heparin Indication: atrial fibrillation  Patient Measurements: Height: 5\' 10"  (177.8 cm) Weight: 72.8 kg (160 lb 6.4 oz) IBW/kg (Calculated) : 73   Vital Signs: Temp: 97.8 F (36.6 C) (04/04 0643) Temp Source: Oral (04/04 0643) BP: 118/95 (04/04 0643) Pulse Rate: 126 (04/04 0643)  Labs: Recent Labs    05/24/19 1511 05/24/19 1511 05/24/19 1739 05/25/19 0418 05/25/19 0742 05/25/19 1024 05/26/19 0355  HGB 15.1   < >  --  14.4  --   --  13.9  HCT 44.7  --   --  42.2  --   --  40.7  PLT 229  --   --  211  --   --  216  HEPARINUNFRC  --   --   --  0.51  --  0.60 1.08*  CREATININE 1.17  --   --   --  1.10  --   --   TROPONINIHS 5  --  6  --   --   --   --    < > = values in this interval not displayed.     Medical History: Past Medical History:  Diagnosis Date  . Atrial fibrillation ([place])   . Tachycardia    Assessment: 69 year old male presenting with CP, hx asymptomatic Aflutter not on anticoagulation PTA. Pharmacy consulted to dose heparin.  Heparin level this AM was elevated at 1.08. Heparin running through R peripheral and lab collected from [facility].  Patient possibly discharging today and MD has transitioned over to apixaban.  Goal of Therapy:   Heparin level 0.3-0.7 units/ml Monitor platelets by anticoagulation protocol: Yes   Plan:  -Stop heparin  -Apixaban 5 mg BID per MD -Will educate patient prior to discharge  Vertis Kelch, PharmD, [facility] PGY2 Cardiology Pharmacy Resident Phone [phone] 05/26/2019       7:37 AM  Please check [email or web address] for unit-specific pharmacist phone numbers

## 2019-05-26 NOTE — Care Management (Signed)
Patient provided with ELiquis 30 day card and verbalized understanding how to use.

## 2019-05-26 NOTE — Progress Notes (Signed)
Progress Note  Patient Name: Bradley Mcbride Date of Encounter: 05/26/2019  Primary Cardiologist: Cristopher Peru, MD   Subjective   Patient received flecainide yesterday.  He did not convert to sinus rhythm.  QTC widened on this dose.  Unfortunately ECG was not performed.  He feels well this morning, though he remains in atrial fibrillation and at times atrial flutter with rapid response.  Inpatient Medications    Scheduled Meds: . atorvastatin  10 mg Oral q1800  . diltiazem  90 mg Oral Q12H  . ezetimibe  10 mg Oral QHS  . multivitamin with minerals  1 tablet Oral Daily  . pantoprazole  40 mg Oral BID   Continuous Infusions: . diltiazem (CARDIZEM) infusion 5 mg/hr (05/25/19 0800)  . heparin 1,100 Units/hr (05/25/19 1931)   PRN Meds: acetaminophen   Vital Signs    Vitals:   05/25/19 1421 05/25/19 2032 05/26/19 0025 05/26/19 0643  BP: (!) 116/50 128/85 (!) 143/93 (!) 118/95  Pulse: (!) 114 (!) 113 (!) 117 (!) 126  Resp:  18 20 20   Temp: 98 F (36.7 C) 98.4 F (36.9 C)  97.8 F (36.6 C)  TempSrc:  Oral  Oral  SpO2: 98% 97% 97% 98%  Weight:    72.8 kg  Height:        Intake/Output Summary (Last 24 hours) at 05/26/2019 0843 Last data filed at 05/26/2019 0800 Gross per 24 hour  Intake 389.53 ml  Output --  Net 389.53 ml   Last 3 Weights 05/26/2019 05/25/2019 05/24/2019  Weight (lbs) 160 lb 6.4 oz 161 lb 14.4 oz 161 lb 12.8 oz  Weight (kg) 72.757 kg 73.437 kg 73.392 kg      Telemetry    Atrial fibrillation- Personally Reviewed  ECG    None new- Personally Reviewed  Physical Exam   GEN: No acute distress.   Neck: No JVD Cardiac:  Tachycardic, irregular, no murmurs, rubs, or gallops.  Respiratory: Clear to auscultation bilaterally. GI: Soft, nontender, non-distended  MS: No edema; No deformity. Neuro:  Nonfocal  Psych: Normal affect   Labs    High Sensitivity Troponin:   Recent Labs  Lab 05/24/19 1511 05/24/19 1739  TROPONINIHS 5 6       Chemistry Recent Labs  Lab 05/24/19 1511 05/24/19 1630 05/25/19 0742  NA 138  --  139  K 4.2  --  3.7  CL 103  --  104  CO2 25  --  24  GLUCOSE 107*  --  109*  BUN 16  --  14  CREATININE 1.17  --  1.10  CALCIUM 10.0  --  9.0  PROT  --  6.8  --   ALBUMIN  --  3.8  --   AST  --  25  --   ALT  --  19  --   ALKPHOS  --  49  --   BILITOT  --  1.7*  --   GFRNONAA >60  --  >60  GFRAA >60  --  >60  ANIONGAP 10  --  11     Hematology Recent Labs  Lab 05/24/19 1511 05/25/19 0418 05/26/19 0355  WBC 10.4 8.7 8.6  RBC 4.57 4.36 4.20*  HGB 15.1 14.4 13.9  HCT 44.7 42.2 40.7  MCV 97.8 96.8 96.9  MCH 33.0 33.0 33.1  MCHC 33.8 34.1 34.2  RDW 12.9 12.8 12.7  PLT 229 211 216    BNPNo results for input(s): BNP, PROBNP in the last 168  hours.   DDimer  Recent Labs  Lab 05/24/19 1922  DDIMER 0.69*     Radiology    DG Chest 2 View  Result Date: 05/24/2019 CLINICAL DATA:  Left-sided chest pain and indigestion EXAM: CHEST - 2 VIEW COMPARISON:  10/05/2017 FINDINGS: The heart size and mediastinal contours are within normal limits. Both lungs are clear. The visualized skeletal structures are unremarkable. IMPRESSION: No active cardiopulmonary disease. Electronically Signed   By: Randa Ngo M.D.   On: 05/24/2019 17:31   CT Angio Chest PE W/Cm &/Or Wo Cm  Result Date: 05/24/2019 CLINICAL DATA:  Chest pain for several hours EXAM: CT ANGIOGRAPHY CHEST WITH CONTRAST TECHNIQUE: Multidetector CT imaging of the chest was performed using the standard protocol during bolus administration of intravenous contrast. Multiplanar CT image reconstructions and MIPs were obtained to evaluate the vascular anatomy. CONTRAST:  1108mL OMNIPAQUE IOHEXOL 350 MG/ML SOLN COMPARISON:  Chest x-ray from earlier in the same day. FINDINGS: Cardiovascular: Thoracic aorta demonstrates mild atherosclerotic calcifications. No aneurysmal dilatation is seen. The degree of opacification is limited. No cardiac  enlargement is noted. The pulmonary artery shows a normal branching pattern bilaterally. No focal filling defect to suggest pulmonary embolism is identified. Scattered coronary calcifications are noted. Mediastinum/Nodes: Thoracic inlet is within normal limits. No hilar or mediastinal adenopathy is noted. The esophagus as visualized is within normal limits. Lungs/Pleura: Lungs are well aerated bilaterally. Minimal dependent atelectatic changes are seen. No focal infiltrate or sizable effusion is noted. No parenchymal nodules are seen. Upper Abdomen: Visualized upper abdomen is unremarkable. Musculoskeletal: Degenerative changes of the thoracic spine are seen. Review of the MIP images confirms the above findings. IMPRESSION: No evidence of pulmonary emboli. Mild bibasilar atelectatic changes. Aortic Atherosclerosis (ICD10-I70.0). Electronically Signed   By: Inez Catalina M.D.   On: 05/24/2019 21:59   ECHOCARDIOGRAM COMPLETE  Result Date: 05/25/2019    ECHOCARDIOGRAM REPORT   Patient Name:   Bradley Mcbride Date of Exam: 05/25/2019 Medical Rec #:  [id number]         Height:       70.0 in Accession #:    [id number]        Weight:       161.9 lb Date of Birth:  10/08/1950         BSA:          1.908 m Patient Age:    69 years          BP:           94/71 mmHg Patient Gender: M                 HR:           100 bpm. Exam Location:  Inpatient Procedure: 2D Echo, Color Doppler and Cardiac Doppler Indications:    R07.9* Chest pain, unspecified  History:        Patient has prior history of Echocardiogram examinations, most                 recent 10/06/2017. Arrythmias:Atrial Fibrillation.  Sonographer:    Raquel Sarna Senior RDCS Referring Phys: [id number] [place]  1. Left ventricular ejection fraction, by estimation, is 60 to 65%. The left ventricle has normal function. The left ventricle has no regional wall motion abnormalities. There is moderate left ventricular hypertrophy. Left ventricular diastolic function   could not be evaluated.  2. Right ventricular systolic function is normal. The right ventricular size is normal. There is normal  pulmonary artery systolic pressure.  3. The mitral valve is grossly normal. Trivial mitral valve regurgitation.  4. The aortic valve is tricuspid. Aortic valve regurgitation is not visualized.  5. Aortic dilatation noted. There is moderate dilatation at the level of the sinuses of Valsalva measuring 42 mm.  6. The inferior vena cava is normal in size with greater than 50% respiratory variability, suggesting right atrial pressure of 3 mmHg. FINDINGS  Left Ventricle: Left ventricular ejection fraction, by estimation, is 60 to 65%. The left ventricle has normal function. The left ventricle has no regional wall motion abnormalities. The left ventricular internal cavity size was normal in size. There is  moderate left ventricular hypertrophy. Left ventricular diastolic function could not be evaluated due to atrial fibrillation. Left ventricular diastolic function could not be evaluated. Right Ventricle: The right ventricular size is normal. No increase in right ventricular wall thickness. Right ventricular systolic function is normal. There is normal pulmonary artery systolic pressure. The tricuspid regurgitant velocity is 2.04 m/s, and  with an assumed right atrial pressure of 3 mmHg, the estimated right ventricular systolic pressure is [id number] mmHg. Left Atrium: Left atrial size was normal in size. Right Atrium: Right atrial size was normal in size. Pericardium: There is no evidence of pericardial effusion. Mitral Valve: The mitral valve is grossly normal. Trivial mitral valve regurgitation. Tricuspid Valve: The tricuspid valve is grossly normal. Tricuspid valve regurgitation is trivial. Aortic Valve: The aortic valve is tricuspid. Aortic valve regurgitation is not visualized. Pulmonic Valve: The pulmonic valve was grossly normal. Pulmonic valve regurgitation is not visualized. Aorta: Aortic  dilatation noted. There is moderate dilatation at the level of the sinuses of Valsalva measuring 42 mm. Venous: The inferior vena cava is normal in size with greater than 50% respiratory variability, suggesting right atrial pressure of 3 mmHg. IAS/Shunts: The interatrial septum was not well visualized.  LEFT VENTRICLE PLAX 2D LVIDd:         3.40 cm LVIDs:         1.90 cm LV PW:         1.30 cm LV IVS:        1.30 cm LVOT diam:     2.50 cm LV SV:         69 LV SV Index:   36 LVOT Area:     4.91 cm  RIGHT VENTRICLE TAPSE (M-mode): 1.5 cm LEFT ATRIUM             Index       RIGHT ATRIUM           Index LA diam:        1.60 cm 0.84 cm/m  RA Area:     12.80 cm LA Vol (A2C):   41.2 ml 21.59 ml/m RA Volume:   29.10 ml  15.25 ml/m LA Vol (A4C):   23.6 ml 12.37 ml/m LA Biplane Vol: 34.2 ml 17.92 ml/m  AORTIC VALVE LVOT Vmax:   74.20 cm/s LVOT Vmean:  54.000 cm/s LVOT VTI:    0.140 m  AORTA Ao Root diam: 3.40 cm Ao Asc diam:  3.50 cm TRICUSPID VALVE TR Peak grad:   16.6 mmHg TR Vmax:        204.00 cm/s  SHUNTS Systemic VTI:  0.14 m Systemic Diam: 2.50 cm Lyman Bishop MD Electronically signed by Lyman Bishop MD Signature Date/Time: 05/25/2019/12:18:13 PM    Final    US Abdomen Limited RUQ  Result Date: 05/25/2019 CLINICAL DATA:  Hyperbilirubinemia. EXAM: ULTRASOUND  ABDOMEN LIMITED RIGHT UPPER QUADRANT COMPARISON:  None. FINDINGS: Gallbladder: No gallstones or wall thickening visualized. No sonographic Murphy sign noted by sonographer. Common bile duct: Diameter: 6 mm Liver: No focal lesion identified. Within normal limits in parenchymal echogenicity. Portal vein is patent on color Doppler imaging with normal direction of blood flow towards the liver. Other: None. IMPRESSION: Normal RIGHT upper quadrant ultrasound. Electronically Signed   By: Franki Cabot M.D.   On: 05/25/2019 10:21    Cardiac Studies   TTE 05/25/19 1. Left ventricular ejection fraction, by estimation, is 60 to 65%. The  left ventricle has  normal function. The left ventricle has no regional  wall motion abnormalities. There is moderate left ventricular hypertrophy.  Left ventricular diastolic function  could not be evaluated.  2. Right ventricular systolic function is normal. The right ventricular  size is normal. There is normal pulmonary artery systolic pressure.  3. The mitral valve is grossly normal. Trivial mitral valve  regurgitation.  4. The aortic valve is tricuspid. Aortic valve regurgitation is not  visualized.  5. Aortic dilatation noted. There is moderate dilatation at the level of  the sinuses of Valsalva measuring 42 mm.  6. The inferior vena cava is normal in size with greater than 50%  respiratory variability, suggesting right atrial pressure of 3 mmHg.   Patient Profile     68 y.o. male with a history of atrial flutter presented to the hospital with atrial flutter and atypical chest pain.  Assessment & Plan    1.  Atrial fibrillation/flutter: Patient remains tachycardic.  He did get flecainide 300 mg yesterday with widening of his QRS and failure to convert.  His echo shows a normal ejection fraction.  I do feel that he Amalio Loe likely need cardioversion, though he does wish to return home.  We Arlethia Basso increase his p.o. diltiazem to 240 mg daily.  If this controls his heart rate, Erhardt Dada arrange for follow-up in cardiology clinic  2.  Chest pain: High-sensitivity troponin negative x2.  CT negative.  Resolved with GI cocktail.  3.  Elevated bilirubin: Liver ultrasound without major abnormality.  Plan for outpatient follow-up.  4.  Ketonuria: Ketones coming down on repeat UA.  Kaylynne Andres likely need follow-up in PCP clinic.  For questions or updates, please contact [place] Please consult [email or web address] for contact info under        Signed, Khyri Hinzman Meredith Leeds, MD  05/26/2019, 8:43 AM

## 2019-06-04 ENCOUNTER — Encounter (HOSPITAL_COMMUNITY): Payer: Self-pay | Admitting: Nurse Practitioner

## 2019-06-04 ENCOUNTER — Other Ambulatory Visit: Payer: Self-pay

## 2019-06-04 ENCOUNTER — Ambulatory Visit (HOSPITAL_COMMUNITY)
Admission: RE | Admit: 2019-06-04 | Discharge: 2019-06-04 | Disposition: A | Discharge status: Home / Self Care | Payer: Medicare Other | Source: Ambulatory Visit | Attending: Nurse Practitioner | Admitting: Nurse Practitioner

## 2019-06-04 VITALS — BP 132/70 | HR 80 | Ht 70.0 in | Wt 165.8 lb

## 2019-06-04 DIAGNOSIS — I484 Atypical atrial flutter: Secondary | ICD-10-CM | POA: Insufficient documentation

## 2019-06-04 DIAGNOSIS — Z79899 Other long term (current) drug therapy: Secondary | ICD-10-CM | POA: Insufficient documentation

## 2019-06-04 DIAGNOSIS — D6869 Other thrombophilia: Secondary | ICD-10-CM

## 2019-06-04 DIAGNOSIS — I480 Paroxysmal atrial fibrillation: Secondary | ICD-10-CM

## 2019-06-04 DIAGNOSIS — F17210 Nicotine dependence, cigarettes, uncomplicated: Secondary | ICD-10-CM | POA: Insufficient documentation

## 2019-06-04 DIAGNOSIS — I4891 Unspecified atrial fibrillation: Secondary | ICD-10-CM | POA: Insufficient documentation

## 2019-06-04 DIAGNOSIS — Z8249 Family history of ischemic heart disease and other diseases of the circulatory system: Secondary | ICD-10-CM | POA: Insufficient documentation

## 2019-06-04 DIAGNOSIS — E785 Hyperlipidemia, unspecified: Secondary | ICD-10-CM | POA: Insufficient documentation

## 2019-06-04 DIAGNOSIS — Z7901 Long term (current) use of anticoagulants: Secondary | ICD-10-CM | POA: Insufficient documentation

## 2019-06-04 MED ORDER — SILDENAFIL CITRATE 50 MG PO TABS: 50.0000 mg | ORAL_TABLET | Freq: Every day | ORAL | 0 refills | Status: AC | PRN

## 2019-06-04 NOTE — Progress Notes (Signed)
Primary Care Physician: Gaynelle Arabian, MD Referring Physician: Dr.Taylor     Bradley Mcbride is a 69 y.o. male with a h/o afib/atypcial atrial flutter  that presented to the ER 4/2 thru 05/26/19,  with left sided chest pain and found to be in afib. He received his first covid shot the day before. He has had cardioversion in the past but was not currently on anticoagulation with  a CHA2DS2VASc score of 1. He was started on eliquis 5 mg bid.   He was given 300 mg flecainide po , did not convert, but his QRS widened so this drug was stopped. He was then started on cardizem and toprol held. He converted to SR. Echo with normal EF, moderate LVH and aortic dilation. He has noted a few flutters since returning home but no sustained arrhythmia.  Today, he denies symptoms of palpitations, chest pain, shortness of breath, orthopnea, PND, lower extremity edema, dizziness, presyncope, syncope, or neurologic sequela. The patient is tolerating medications without difficulties and is otherwise without complaint today.   Past Medical History:  Diagnosis Date  . Atrial fibrillation ([place])   . Chronic anticoagulation   . Hyperlipidemia   . Tachycardia    Past Surgical History:  Procedure Laterality Date  . CARDIOVERSION N/A 10/06/2017   Procedure: CARDIOVERSION;  Surgeon: Fay Records, MD;  Location: [facility];  Service: Cardiovascular;  Laterality: N/A;  . ORBITAL FRACTURE SURGERY    . TEE WITHOUT CARDIOVERSION N/A 10/06/2017   Procedure: TRANSESOPHAGEAL ECHOCARDIOGRAM (TEE);  Surgeon: Fay Records, MD;  Location: [facility] ENDOSCOPY;  Service: Cardiovascular;  Laterality: N/A;  . UMBILICAL HERNIA REPAIR      Current Outpatient Medications  Medication Sig Dispense Refill  . apixaban (ELIQUIS) 5 MG TABS tablet Take 1 tablet (5 mg total) by mouth 2 (two) times daily. 180 tablet 3  . Coenzyme Q10 (COQ10 PO) Take 1 capsule by mouth at bedtime.    Marland Kitchen diltiazem (CARDIZEM CD) 240 MG 24 hr capsule Take 1  capsule (240 mg total) by mouth daily. 90 capsule 3  . glucosamine-chondroitin 500-400 MG tablet Take 1 tablet by mouth 2 (two) times daily.    . Multiple Vitamin (MULTIVITAMIN WITH MINERALS) TABS tablet Take 1 tablet by mouth daily.    . Omega-3 Fatty Acids (FISH OIL) 1000 MG CAPS Take 2,000 mg by mouth 2 (two) times daily.    . pravastatin (PRAVACHOL) 40 MG tablet Take 1 tablet (40 mg total) by mouth every evening. 90 tablet 3   No current facility-administered medications for this encounter.    No Known Allergies  Social History   Socioeconomic History  . Marital status: Married    Spouse name: Not on file  . Number of children: Not on file  . Years of education: Not on file  . Highest education level: Not on file  Occupational History  . Not on file  Tobacco Use  . Smoking status: Current Every Day Smoker    Types: Cigarettes  . Smokeless tobacco: Never Used  . Tobacco comment: 5 cigarettes daily  Substance and Sexual Activity  . Alcohol use: Yes    Alcohol/week: 1.0 - 2.0 standard drinks    Types: 1 - 2 Shots of liquor per week    Comment: occasional  . Drug use: Never  . Sexual activity: Not on file  Other Topics Concern  . Not on file  Social History Narrative  . Not on file   Social Determinants of Health  Financial Resource Strain:   . Difficulty of Paying Living Expenses:   Food Insecurity:   . Worried About Charity fundraiser in the Last Year:   . Arboriculturist in the Last Year:   Transportation Needs:   . Film/video editor (Medical):   Marland Kitchen Lack of Transportation (Non-Medical):   Physical Activity:   . Days of Exercise per Week:   . Minutes of Exercise per Session:   Stress:   . Feeling of Stress :   Social Connections:   . Frequency of Communication with Friends and Family:   . Frequency of Social Gatherings with Friends and Family:   . Attends Religious Services:   . Active Member of Clubs or Organizations:   . Attends Archivist  Meetings:   Marland Kitchen Marital Status:   Intimate Partner Violence:   . Fear of Current or Ex-Partner:   . Emotionally Abused:   Marland Kitchen Physically Abused:   . Sexually Abused:     Family History  Problem Relation Age of Onset  . Heart disease Mother     ROS- All systems are reviewed and negative except as per the HPI above  Physical Exam: Vitals:   06/04/19 1014  BP: 132/70  Pulse: 80  Weight: 75.2 kg  Height: 5\' 10"  (1.778 m)   Wt Readings from Last 3 Encounters:  06/04/19 75.2 kg  05/26/19 72.8 kg  12/26/17 77.1 kg    Labs: Lab Results  Component Value Date   NA 139 05/25/2019   K 3.7 05/25/2019   CL 104 05/25/2019   CO2 24 05/25/2019   GLUCOSE 109 (H) 05/25/2019   BUN 14 05/25/2019   CREATININE 1.10 05/25/2019   CALCIUM 9.0 05/25/2019   MG 2.0 10/05/2017   No results found for: INR Lab Results  Component Value Date   CHOL 126 05/25/2019   HDL 76 05/25/2019   LDLCALC 43 05/25/2019   TRIG 37 05/25/2019     GEN- The patient is well appearing, alert and oriented x 3 today.   Head- normocephalic, atraumatic Eyes-  Sclera clear, conjunctiva pink Ears- hearing intact Oropharynx- clear Neck- supple, no JVP Lymph- no cervical lymphadenopathy Lungs- Clear to ausculation bilaterally, normal work of breathing Heart- Regular rate and rhythm, no murmurs, rubs or gallops, PMI not laterally displaced GI- soft, NT, ND, + BS Extremities- no clubbing, cyanosis, or edema MS- no significant deformity or atrophy Skin- no rash or lesion Psych- euthymic mood, full affect Neuro- strength and sensation are intact  EKG- NSR 80 bpm, pr int 160 ms, qrs int 88 ms, qtc 429 ms  Echo-TTE 05/25/19 1. Left ventricular ejection fraction, by estimation, is 60 to 65%. The  left ventricle has normal function. The left ventricle has no regional  wall motion abnormalities. There is moderate left ventricular hypertrophy.  Left ventricular diastolic function  could not be evaluated.  2. Right  ventricular systolic function is normal. The right ventricular  size is normal. There is normal pulmonary artery systolic pressure.  3. The mitral valve is grossly normal. Trivial mitral valve  regurgitation.  4. The aortic valve is tricuspid. Aortic valve regurgitation is not  visualized.  5. Aortic dilatation noted. There is moderate dilatation at the level of  the sinuses of Valsalva measuring 42 mm.  6. The inferior vena cava is normal in size with greater than 50%  respiratory variability, suggesting right atrial pressure of 3 mmHg.     Assessment and Plan: 1. Atypical  atrial flutter/afib ? Trigger first  covid shot Continue  Cardizem 240 mg daily  Flecainide  300 mg dose widened QRS and was stopped   2. CHA2DS2VASc score of 1 Continue eliquis 5 mg bid   Will get f/u appointment with [name] in 4 weeks    Geroge Baseman. [place]retna Bergin, [facility] [address] [place], [place] [address] [phone]

## 2019-06-04 NOTE — Addendum Note (Signed)
Encounter addended by: Juluis Mire, RN on: 06/04/2019 1:38 PM  Actions taken: Order list changed

## 2019-06-24 ENCOUNTER — Telehealth: Payer: Self-pay | Admitting: Internal Medicine

## 2019-06-24 NOTE — Telephone Encounter (Signed)
Returned call to Pt.  Advised ok to have teeth cleaning.  Advised NOT to hold Eliquis prior to appt.  Pt indicates understanding

## 2019-06-24 NOTE — Telephone Encounter (Addendum)
   Primary Cardiologist: Cristopher Peru, MD  Chart reviewed as part of pre-operative protocol coverage. Simple dental extractions are considered low risk procedures per guidelines and generally do not require any specific cardiac clearance. It is also generally accepted that for simple extractions and dental cleanings, there is no need to interrupt blood thinner therapy.   SBE prophylaxis is not required for the patient. No need to hold Eliquis.  I will route this recommendation to the requesting party via Epic fax function and remove from pre-op pool.  Please call with questions.  Reino Bellis, NP 06/24/2019, 4:59 PM

## 2019-06-24 NOTE — Telephone Encounter (Signed)
1. What dental office are you calling from? Dr. Remonia Richter DDS  2. What is your office phone number? [phone]  3. What is your fax number?   4. What type of procedure is the patient having performed? Routine cleaning   5. What date is procedure scheduled or is the patient there now? 06-26-19 (if the patient is at the dentist's office question goes to their cardiologist if he/she is in the office.  If not, question should go to the DOD).   6. What is your question (ex. Antibiotics prior to procedure, holding medication-we need to know how long dentist wants pt to hold med)? If it is safe for the patient to have a routine cleaning on Wednesday 06-26-19. The patient is on Eliquis  Patient wants to know if it safe for him to have a cleaning on Wednesday

## 2019-07-03 ENCOUNTER — Ambulatory Visit (INDEPENDENT_AMBULATORY_CARE_PROVIDER_SITE_OTHER): Payer: Medicare Other | Admitting: Internal Medicine

## 2019-07-03 ENCOUNTER — Encounter: Payer: Self-pay | Admitting: Internal Medicine

## 2019-07-03 ENCOUNTER — Other Ambulatory Visit: Payer: Self-pay

## 2019-07-03 VITALS — BP 100/72 | HR 79 | Ht 70.0 in | Wt 166.0 lb

## 2019-07-03 DIAGNOSIS — I4891 Unspecified atrial fibrillation: Secondary | ICD-10-CM

## 2019-07-03 DIAGNOSIS — I4892 Unspecified atrial flutter: Secondary | ICD-10-CM

## 2019-07-03 DIAGNOSIS — IMO0002 Reserved for concepts with insufficient information to code with codable children: Secondary | ICD-10-CM

## 2019-07-03 NOTE — Patient Instructions (Addendum)
Medication Instructions:  Your physician recommends that you continue on your current medications as directed. Please refer to the Current Medication list given to you today.  You may take an extra cardizem for breakthrough atrial fibrillation  Labwork: None ordered.  Testing/Procedures: None ordered.  Follow-Up: Your physician wants you to follow-up in: 6 months with [name].   You will receive a reminder letter in the mail two months in advance. If you don't receive a letter, please call our office to schedule the follow-up appointment.   Any Other Special Instructions Will Be Listed Below (If Applicable).  If you need a refill on your cardiac medications before your next appointment, please call your pharmacy.

## 2019-07-03 NOTE — Progress Notes (Addendum)
HPI Bradley Mcbride returns today for followup of atrial fib/flutter. He is a pleasant 69 yo man with a h/o paroxsysmal atrial arrhythmias who was seen just over a month ago in the ED and was treated with flecainide and then IV cardizem and he reverted back to NSR. He had just received his Covid vaccine. He has not had any additional symptoms except for some snoring. He has had a second vaccine with no atrial arrhythmias. He has been maintained on a combination of cardizem and eliquis.  No Known Allergies   Current Outpatient Medications  Medication Sig Dispense Refill  . apixaban (ELIQUIS) 5 MG TABS tablet Take 1 tablet (5 mg total) by mouth 2 (two) times daily. 180 tablet 3  . Coenzyme Q10 (COQ10 PO) Take 1 capsule by mouth at bedtime.    Marland Kitchen diltiazem (CARDIZEM CD) 240 MG 24 hr capsule Take 1 capsule (240 mg total) by mouth daily. 90 capsule 3  . glucosamine-chondroitin 500-400 MG tablet Take 1 tablet by mouth 2 (two) times daily.    . Multiple Vitamin (MULTIVITAMIN WITH MINERALS) TABS tablet Take 1 tablet by mouth daily.    . Omega-3 Fatty Acids (FISH OIL) 1000 MG CAPS Take 2,000 mg by mouth 2 (two) times daily.    . pravastatin (PRAVACHOL) 40 MG tablet Take 1 tablet (40 mg total) by mouth every evening. 90 tablet 3  . sildenafil (VIAGRA) 50 MG tablet Take 1 tablet (50 mg total) by mouth daily as needed for erectile dysfunction. 10 tablet 0   No current facility-administered medications for this visit.     Past Medical History:  Diagnosis Date  . Atrial fibrillation ([place])   . Chronic anticoagulation   . Hyperlipidemia   . Tachycardia     ROS:   All systems reviewed and negative except as noted in the HPI.   Past Surgical History:  Procedure Laterality Date  . CARDIOVERSION N/A 10/06/2017   Procedure: CARDIOVERSION;  Surgeon: Fay Records, MD;  Location: [place];  Service: Cardiovascular;  Laterality: N/A;  . ORBITAL FRACTURE SURGERY    . TEE WITHOUT CARDIOVERSION  N/A 10/06/2017   Procedure: TRANSESOPHAGEAL ECHOCARDIOGRAM (TEE);  Surgeon: Fay Records, MD;  Location: [facility] ENDOSCOPY;  Service: Cardiovascular;  Laterality: N/A;  . UMBILICAL HERNIA REPAIR       Family History  Problem Relation Age of Onset  . Heart disease Mother      Social History   Socioeconomic History  . Marital status: Married    Spouse name: Not on file  . Number of children: Not on file  . Years of education: Not on file  . Highest education level: Not on file  Occupational History  . Not on file  Tobacco Use  . Smoking status: Current Every Day Smoker    Types: Cigarettes  . Smokeless tobacco: Never Used  . Tobacco comment: 5 cigarettes daily  Substance and Sexual Activity  . Alcohol use: Yes    Alcohol/week: 1.0 - 2.0 standard drinks    Types: 1 - 2 Shots of liquor per week    Comment: occasional  . Drug use: Never  . Sexual activity: Not on file  Other Topics Concern  . Not on file  Social History Narrative  . Not on file   Social Determinants of Health   Financial Resource Strain:   . Difficulty of Paying Living Expenses:   Food Insecurity:   . Worried About Charity fundraiser in the  Last Year:   . [place] in the Last Year:   Transportation Needs:   . Film/video editor (Medical):   Marland Kitchen Lack of Transportation (Non-Medical):   Physical Activity:   . Days of Exercise per Week:   . Minutes of Exercise per Session:   Stress:   . Feeling of Stress :   Social Connections:   . Frequency of Communication with Friends and Family:   . Frequency of Social Gatherings with Friends and Family:   . Attends Religious Services:   . Active Member of Clubs or Organizations:   . Attends Archivist Meetings:   Marland Kitchen Marital Status:   Intimate Partner Violence:   . Fear of Current or Ex-Partner:   . Emotionally Abused:   Marland Kitchen Physically Abused:   . Sexually Abused:      BP 100/72   Pulse 79   Ht 5\' 10"  (1.778 m)   Wt 166 lb (75.3 kg)    SpO2 97%   BMI 23.82 kg/m   Physical Exam:  Well appearing 69 yo man, NAD HEENT: Unremarkable Neck:  6 cm JVD, no thyromegally Lymphatics:  No adenopathy Back:  No CVA tenderness Lungs:  Clear with no wheezes HEART:  Regular rate rhythm, no murmurs, no rubs, no clicks Abd:  soft, positive bowel sounds, no organomegally, no rebound, no guarding Ext:  2 plus pulses, no edema, no cyanosis, no clubbing Skin:  No rashes no nodules Neuro:  CN II through XII intact, motor grossly intact  EKG - NSR with frequent PAC's   Assess/Plan: 1. PAF - he appears to be maintaining NSR. He will continue cardizem. He is also currently taking Eliquis. I would like him to continue this for now and to be sure that he does not in fact have HTN. I would consider stopping his Eliquis in the future. He is encouraged to avoid caffeine and ETOH.  2. Snoring - we will obtain a sleep study to better characterize sleep apnea or not.   Mikle Bosworth.D.

## 2019-07-11 ENCOUNTER — Telehealth: Payer: Self-pay | Admitting: Internal Medicine

## 2019-07-11 DIAGNOSIS — R413 Other amnesia: Secondary | ICD-10-CM

## 2019-07-11 NOTE — Telephone Encounter (Signed)
I spoke with patient. He reports he discussed his memory issue and the possibility of being seen at [facility] for evaluation of this at recent office visit with Dr Lovena Le.  Patient states Dr Lovena Le felt this would be good idea and encouraged him to reach out to [facility].  Patient has contacted [place] [facility] and referral is needed.  He is asking if Dr Lovena Le would provide this referral.  Fax number for referral is [phone].

## 2019-07-11 NOTE — Telephone Encounter (Signed)
New message   Patient wants to discuss referral to [facility] for memory care. Please call.

## 2019-07-18 NOTE — Telephone Encounter (Signed)
Bradley Mcbride is calling back in regards to the status of the referral he requested be sent to [name] by [name]. He states if [name] isn't going to do it he would like to know so he can try to have another Doctor send it for him. [name] to call him with [name] answer on if he is sending it or not. Please advise.

## 2019-07-19 NOTE — Telephone Encounter (Signed)
Returned call to pt.  Advised per Dr. Corliss Parish to refer to [facility]  Order placed.

## 2019-07-26 ENCOUNTER — Telehealth: Payer: Self-pay | Admitting: Internal Medicine

## 2019-07-26 NOTE — Telephone Encounter (Signed)
Patient is calling about a referral that was going to be sent to [facility].  He said they haven't received it yet.

## 2019-07-29 NOTE — Telephone Encounter (Signed)
Left detailed message for Pt advising referral had been sent.

## 2019-11-28 ENCOUNTER — Telehealth: Payer: Self-pay | Admitting: Internal Medicine

## 2019-11-28 DIAGNOSIS — R0683 Snoring: Secondary | ICD-10-CM

## 2019-11-28 NOTE — Telephone Encounter (Signed)
Patient went to see the Neurologist at [facility]. The Neurologist recommended [name] order a sleep study for the patient. Please call to arrange sleep study

## 2019-11-28 NOTE — Telephone Encounter (Signed)
Spoke with Pt.  Pt's neurologist is recommending a home sleep study for sleep disturbance and snoring.  Per Pt [facility] wants [name] to initiate this test.  Advised Pt will discuss with [name] and order if approved.

## 2019-12-02 ENCOUNTER — Other Ambulatory Visit: Payer: Self-pay | Admitting: Physician Assistant

## 2019-12-02 ENCOUNTER — Other Ambulatory Visit (HOSPITAL_COMMUNITY): Payer: Self-pay | Admitting: Physician Assistant

## 2019-12-02 DIAGNOSIS — R4701 Aphasia: Secondary | ICD-10-CM

## 2019-12-03 ENCOUNTER — Telehealth: Payer: Self-pay | Admitting: Internal Medicine

## 2019-12-03 NOTE — Telephone Encounter (Signed)
Patient's wife states she is returning Jenny's call.

## 2019-12-03 NOTE — Telephone Encounter (Signed)
See open phone note from 10/7.  Duplicate phone note. Closing.

## 2019-12-03 NOTE — Telephone Encounter (Signed)
Per Dr. Corliss Parish to order sleep study for snoring.  Fax sent to [facility].

## 2019-12-13 ENCOUNTER — Ambulatory Visit (HOSPITAL_COMMUNITY)
Admission: RE | Admit: 2019-12-13 | Discharge: 2019-12-13 | Disposition: A | Discharge status: Home / Self Care | Payer: Medicare Other | Source: Ambulatory Visit | Attending: Physician Assistant | Admitting: Physician Assistant

## 2019-12-13 ENCOUNTER — Other Ambulatory Visit: Payer: Self-pay

## 2019-12-13 DIAGNOSIS — R4701 Aphasia: Secondary | ICD-10-CM | POA: Insufficient documentation

## 2019-12-17 ENCOUNTER — Encounter (INDEPENDENT_AMBULATORY_CARE_PROVIDER_SITE_OTHER): Payer: Medicare Other | Admitting: Cardiology

## 2019-12-17 DIAGNOSIS — G4733 Obstructive sleep apnea (adult) (pediatric): Secondary | ICD-10-CM | POA: Diagnosis not present

## 2019-12-31 ENCOUNTER — Telehealth: Payer: Self-pay | Admitting: Internal Medicine

## 2019-12-31 ENCOUNTER — Ambulatory Visit (INDEPENDENT_AMBULATORY_CARE_PROVIDER_SITE_OTHER): Payer: Medicare Other | Admitting: Internal Medicine

## 2019-12-31 ENCOUNTER — Other Ambulatory Visit: Payer: Self-pay

## 2019-12-31 ENCOUNTER — Encounter: Payer: Self-pay | Admitting: Internal Medicine

## 2019-12-31 DIAGNOSIS — I480 Paroxysmal atrial fibrillation: Secondary | ICD-10-CM | POA: Insufficient documentation

## 2019-12-31 NOTE — Telephone Encounter (Signed)
PCP updated.

## 2019-12-31 NOTE — Telephone Encounter (Signed)
Patient would like his PCP updated in his chart to Rachell Cipro. He states he will see her 01/22/2020.

## 2019-12-31 NOTE — Patient Instructions (Addendum)

## 2019-12-31 NOTE — Progress Notes (Signed)
HPI [name] returns today for followup. He is a pleasant 69 yo man with atrial fib/flutter who was placed on flecainide. He has been bothered by daytime somnolence and fatigue and was scheduled to have a sleep study. The results are pending. He has reduced his ETOH consumption, stopping drinking bourbon. He drinks 2 beers a day. No Known Allergies   Current Outpatient Medications  Medication Sig Dispense Refill  . apixaban (ELIQUIS) 5 MG TABS tablet Take 1 tablet (5 mg total) by mouth 2 (two) times daily. 180 tablet 3  . Coenzyme Q10 (COQ10 PO) Take 1 capsule by mouth at bedtime.    Marland Kitchen diltiazem (CARDIZEM CD) 240 MG 24 hr capsule Take 1 capsule (240 mg total) by mouth daily. 90 capsule 3  . glucosamine-chondroitin 500-400 MG tablet Take 1 tablet by mouth 2 (two) times daily.    . Multiple Vitamin (MULTIVITAMIN WITH MINERALS) TABS tablet Take 1 tablet by mouth daily.    . Omega-3 Fatty Acids (FISH OIL) 1000 MG CAPS Take 2,000 mg by mouth 2 (two) times daily.    . pravastatin (PRAVACHOL) 40 MG tablet Take 1 tablet (40 mg total) by mouth every evening. 90 tablet 3  . sildenafil (VIAGRA) 50 MG tablet Take 1 tablet (50 mg total) by mouth daily as needed for erectile dysfunction. 10 tablet 0   No current facility-administered medications for this visit.     Past Medical History:  Diagnosis Date  . Atrial fibrillation ([place])   . Chronic anticoagulation   . Hyperlipidemia   . Tachycardia     ROS:   All systems reviewed and negative except as noted in the HPI.   Past Surgical History:  Procedure Laterality Date  . CARDIOVERSION N/A 10/06/2017   Procedure: CARDIOVERSION;  Surgeon: Bradley Records, MD;  Location: [place];  Service: Cardiovascular;  Laterality: N/A;  . ORBITAL FRACTURE SURGERY    . TEE WITHOUT CARDIOVERSION N/A 10/06/2017   Procedure: TRANSESOPHAGEAL ECHOCARDIOGRAM (TEE);  Surgeon: Bradley Records, MD;  Location: [facility] ENDOSCOPY;  Service: Cardiovascular;   Laterality: N/A;  . UMBILICAL HERNIA REPAIR       Family History  Problem Relation Age of Onset  . Heart disease Mother      Social History   Socioeconomic History  . Marital status: Married    Spouse name: Not on file  . Number of children: Not on file  . Years of education: Not on file  . Highest education level: Not on file  Occupational History  . Not on file  Tobacco Use  . Smoking status: Current Every Day Smoker    Types: Cigarettes  . Smokeless tobacco: Never Used  . Tobacco comment: 5 cigarettes daily  Vaping Use  . Vaping Use: Never used  Substance and Sexual Activity  . Alcohol use: Yes    Alcohol/week: 1.0 - 2.0 standard drink    Types: 1 - 2 Shots of liquor per week    Comment: occasional  . Drug use: Never  . Sexual activity: Not on file  Other Topics Concern  . Not on file  Social History Narrative  . Not on file   Social Determinants of Health   Financial Resource Strain:   . Difficulty of Paying Living Expenses: Not on file  Food Insecurity:   . Worried About Charity fundraiser in the Last Year: Not on file  . Ran Out of Food in the Last Year: Not on file  Transportation  Needs:   . Lack of Transportation (Medical): Not on file  . Lack of Transportation (Non-Medical): Not on file  Physical Activity:   . Days of Exercise per Week: Not on file  . Minutes of Exercise per Session: Not on file  Stress:   . Feeling of Stress : Not on file  Social Connections:   . Frequency of Communication with Friends and Family: Not on file  . Frequency of Social Gatherings with Friends and Family: Not on file  . Attends Religious Services: Not on file  . Active Member of Clubs or Organizations: Not on file  . Attends Archivist Meetings: Not on file  . Marital Status: Not on file  Intimate Partner Violence:   . Fear of Current or Ex-Partner: Not on file  . Emotionally Abused: Not on file  . Physically Abused: Not on file  . Sexually Abused: Not  on file     BP 122/68   Pulse 78   Ht 5\' 10"  (1.778 m)   Wt 168 lb (76.2 kg)   SpO2 94%   BMI 24.11 kg/m   Physical Exam:  Well appearing 69 yo man, NAD HEENT: Unremarkable Neck:  No JVD, no thyromegally Lymphatics:  No adenopathy Back:  No CVA tenderness Lungs:  Clear with no wheezes HEART:  Regular rate rhythm, no murmurs, no rubs, no clicks Abd:  soft, positive bowel sounds, no organomegally, no rebound, no guarding Ext:  2 plus pulses, no edema, no cyanosis, no clubbing Skin:  No rashes no nodules Neuro:  CN II through XII intact, motor grossly intact  EKG - NSR with PAC's  Assess/Plan: 1. PAF - he is maintaining NSR. He will continue AV nodal blocking drugs. 2. Coags - he has had no bleeding on Elqius. 3. Sleep apnea - he has worn a CPAP. We do not have the results of his sleep study.  Bradley Overlie [name],MD

## 2020-01-05 ENCOUNTER — Ambulatory Visit: Payer: Medicare Other

## 2020-01-05 DIAGNOSIS — R0683 Snoring: Secondary | ICD-10-CM

## 2020-01-05 NOTE — Procedures (Signed)
    Sleep Study Report  Patient Information  Name: Bradley Mcbride  ID: [id number] Birth Date: [birth year]  Age: 69  Gender:Male BMI: 23.7 (W=165 lb, H=5' 10'') Study Date:12/17/2019 Referring Physician : Cristopher Peru. MD  TEST DESCRIPTION: Home sleep apnea testing was completed using the WatchPat, a Type 1 device, utilizing peripheral arterial tonometry (PAT), chest movement, actigraphy, pulse oximetry, pulse rate, body position and snore. AHI was calculated with apnea and hypopnea using valid sleep time as the denominator. RDI includes apneas, hypopneas, and RERAs. The data acquired and the scoring of sleep and all associated events were performed in accordance with the recommended standards and specifications as outlined in the AASM Manual for the Scoring of Sleep and Associated Events 2.2.0 (2015).  FINDINGS: 1. Severe Obstructive Sleep Apnea with AHI 39.7/hr.  2. No Central Sleep Apnea with pAHIc 0/hr. 3. Oxygen desaturations as low as 84%. 4. Moderate snoring was present. O2 sats were < 88% for 0.6 min. 5. Total sleep time was 8 hrs and 25 min. 6. 9.2% of total sleep time was spent in the supine position.  7. Normal sleep onset latency at 20 min.  8. Prolonged REM sleep onset latency at 122 min.  9. Total awakenings were 14.  DIAGNOSIS:  Severe Obstructive Sleep Apnea (G47.33)  Recommendations  1. Clinical correlation of these findings is necessary. The decision to treat obstructive sleep apnea (OSA) is usually based on the presence of apnea symptoms or the presence of associated medical conditions such as Hypertension, Congestive Heart Failure, Atrial Fibrillation or Obesity. The most common symptoms of OSA are snoring, gasping for breath while sleeping, daytime sleepiness and fatigue.   2. Initiating apnea therapy is recommended given the presence of symptoms and/or associated conditions.   Recommend proceeding with one of the following:   a. Auto-CPAP therapy with a  pressure range of 5-20cm H2O.   b. An oral appliance (OA) that can be obtained from certain dentists with expertise in sleep medicine. These are primarily of use in non-obese patients with mild and moderate disease.   c. An ENT consultation which may be useful to look for specific causes of obstruction and possible treatment ptions.   d. If patient is intolerant to PAP therapy, consider referral to ENT for evaluation for hypoglossal nerve stimulator.   3. Close follow-up is necessary to ensure success with CPAP or oral appliance therapy for maximum benefit .  4. A follow-up oximetry study on CPAP is recommended to assess the adequacy of therapy and determine the need for supplemental oxygen or the potential need for Bi-level therapy. An arterial blood gas to determine the adequacy of baseline ventilation and oxygenation should also be considered.  5. Healthy sleep recommendations include: adequate nightly sleep (normal 7-9 hrs/night), avoidance of caffeine after noon and alcohol near bedtime, and maintaining a sleep environment that is cool, dark and quiet.  6. Weight loss for overweight patients is recommended. Even modest amounts of weight loss can significantly improve the severity of sleep apnea.  7. Snoring recommendations include: weight loss where appropriate, side sleeping, and avoidance of alcohol before bed.  8. Operation of motor vehicle or dangerous equipment must be avoided when feeling drowsy, excessively sleepy, or  mentally fatigued.  Report prepared by: Signature: Fransico Him, MD [facility], Diplomat ABSM  Electronically Signed: Jan 05, 2020

## 2020-01-07 ENCOUNTER — Telehealth: Payer: Self-pay | Admitting: *Deleted

## 2020-01-07 DIAGNOSIS — G4733 Obstructive sleep apnea (adult) (pediatric): Secondary | ICD-10-CM

## 2020-01-07 NOTE — Telephone Encounter (Signed)
-----   Message from Sueanne Margarita, MD sent at 01/05/2020  4:15 PM EST ----- Please let patient know that they have sleep apnea and recommend auto CPAP titration through DME  Orders have been placed in Epic. Please set 8 week OV with me.

## 2020-01-07 NOTE — Telephone Encounter (Signed)
Informed patient of sleep study results and patient understanding was verbalized. Patient understands her sleep study showed they have sleep apnea and recommend auto CPAP titration through DME Orders have been placed in Epic. Please set 8 week OV with me.    Upon patient request DME selection is [facility]. Patient understands she/he will be contacted by [facility] to set up her/he cpap. Patient understands to call if [facility] does not contact her/he with new setup in a timely manner. Patient understands they will be called once confirmation has been received from [facility] that they have received their new machine to schedule 10 week follow up appointment.   [facility] notified of new cpap order  Please add to airview Patient was grateful for the call and thanked me.

## 2020-02-25 ENCOUNTER — Other Ambulatory Visit: Payer: Self-pay

## 2020-02-25 ENCOUNTER — Ambulatory Visit (INDEPENDENT_AMBULATORY_CARE_PROVIDER_SITE_OTHER): Payer: Medicare Other | Admitting: Plastic Surgery

## 2020-02-25 ENCOUNTER — Other Ambulatory Visit (HOSPITAL_COMMUNITY)
Admission: RE | Admit: 2020-02-25 | Discharge: 2020-02-25 | Disposition: A | Discharge status: Home / Self Care | Payer: Medicare Other | Source: Ambulatory Visit | Attending: Plastic Surgery | Admitting: Plastic Surgery

## 2020-02-25 ENCOUNTER — Encounter: Payer: Self-pay | Admitting: Plastic Surgery

## 2020-02-25 DIAGNOSIS — L819 Disorder of pigmentation, unspecified: Secondary | ICD-10-CM | POA: Diagnosis not present

## 2020-02-25 DIAGNOSIS — L989 Disorder of the skin and subcutaneous tissue, unspecified: Secondary | ICD-10-CM | POA: Insufficient documentation

## 2020-02-25 DIAGNOSIS — C4491 Basal cell carcinoma of skin, unspecified: Secondary | ICD-10-CM

## 2020-02-25 HISTORY — DX: Basal cell carcinoma of skin, unspecified: C44.91

## 2020-02-25 NOTE — Progress Notes (Signed)
Procedure Note  Preoperative Dx: Changing skin lesion right periorbital area  Postoperative Dx: Same  Procedure: Excision of changing skin lesion right periorbital area concerning for squamous cell carcinoma 1.5 x 2.5 cm  Anesthesia: Lidocaine 1% with 1:100,000 epinepherine  Indication: History of squamous cell carcinoma  Description of Procedure: Risks and complications were explained to the patient.  Consent was confirmed and the patient understands the risks and benefits.  The potential complications and alternatives were explained and the patient consents.  The patient expressed understanding the option of not having the procedure and the risks of a scar.  Time out was called and all information was confirmed to be correct.    The area was prepped and drapped.  Lidocaine 1% with epinepherine was injected in the subcutaneous area.  After waiting several minutes for the local to take affect a #15 blade was used to excise the area in an eliptical pattern.  A 5-0 Monocryl was used to close the deep layers with simple interrupted stitches.  The skin edges were reapproximated with 5-0 Monocryl subcuticular running closure.  A dressing was applied.  The patient was given instructions on how to care for the area and a follow up appointment.  Bradley Mcbride tolerated the procedure well and there were no complications. The specimen was marked long stitch anterior and short superior then sent to pathology.

## 2020-02-27 LAB — SURGICAL PATHOLOGY

## 2020-02-28 ENCOUNTER — Telehealth: Payer: Self-pay | Admitting: Plastic Surgery

## 2020-02-28 NOTE — Telephone Encounter (Signed)
Patient returning Bonita's call.

## 2020-03-02 ENCOUNTER — Telehealth: Payer: Self-pay

## 2020-03-02 NOTE — Telephone Encounter (Signed)
Call to pt- no answer- left v/m informing pt that per Dr. Marla Roe: the margins were clear of any abnormal/basal cells. Left message to have him call for any concerns-otherwise he has a f/u in office with [facility], [place] on 03/05/20

## 2020-03-05 ENCOUNTER — Other Ambulatory Visit: Payer: Self-pay

## 2020-03-05 ENCOUNTER — Encounter: Payer: Self-pay | Admitting: Surgical

## 2020-03-05 ENCOUNTER — Ambulatory Visit (INDEPENDENT_AMBULATORY_CARE_PROVIDER_SITE_OTHER): Payer: Medicare Other | Admitting: Surgical

## 2020-03-05 VITALS — BP 130/75 | HR 79 | Temp 97.4°F

## 2020-03-05 DIAGNOSIS — C44111 Basal cell carcinoma of skin of unspecified eyelid, including canthus: Secondary | ICD-10-CM

## 2020-03-05 NOTE — Progress Notes (Signed)
Patient is a 70 year old male here for follow-up after excision of a changing skin lesion of his right periorbital area with Dr. Marla Roe on 02/25/2020.  Surgical pathology showed basal cell carcinoma, nodular, free margins  Patient is here today for possible suture removal and follow-up/evaluation.  Patient reports she is doing well.  No complaints.  No infectious symptoms.  On exam right periorbital incision is well-healed.  Monocryl suture knots were removed.  Patient tolerated this fine.  There is no erythema noted.  No drainage noted.  No dehiscence noted.  Ambulatory referral for dermatology placed as patient would benefit from yearly skin checks. Recommend following up as needed.  Recommend calling with any questions or concerns. No sign of infection, seroma.  Recommend sunscreen when exposed to the sun, may also cover with a hat or with a Band-Aid to prevent discoloration.

## 2020-03-18 ENCOUNTER — Telehealth: Payer: Self-pay | Admitting: Physician Assistant

## 2020-03-18 NOTE — Telephone Encounter (Signed)
Referral from [facility]; appt. 6/14 @10  w/KRS

## 2020-04-28 ENCOUNTER — Other Ambulatory Visit: Payer: Self-pay | Admitting: Family Medicine

## 2020-04-28 DIAGNOSIS — F17200 Nicotine dependence, unspecified, uncomplicated: Secondary | ICD-10-CM

## 2020-06-01 ENCOUNTER — Ambulatory Visit
Admission: RE | Admit: 2020-06-01 | Discharge: 2020-06-01 | Disposition: A | Discharge status: Home / Self Care | Payer: Medicare Other | Source: Ambulatory Visit | Attending: Family Medicine | Admitting: Family Medicine

## 2020-06-01 DIAGNOSIS — F17200 Nicotine dependence, unspecified, uncomplicated: Secondary | ICD-10-CM

## 2020-06-04 ENCOUNTER — Telehealth: Payer: Self-pay | Admitting: Internal Medicine

## 2020-06-04 NOTE — Telephone Encounter (Signed)
Patient states that they want to put him on cpap machine, he has some reservation towards that and he would like to discuss those concerns with the nurse.

## 2020-06-10 NOTE — Telephone Encounter (Signed)
Left message for patient to call back  

## 2020-06-18 NOTE — Telephone Encounter (Signed)
Reached out to the patient to offer assistance but he had already made up his mind to move forward with getting his cpap. I gave them my direct number to call should questions arise once he receives his cpap.

## 2020-07-13 ENCOUNTER — Ambulatory Visit: Payer: Medicare Other | Attending: Physician Assistant

## 2020-07-13 ENCOUNTER — Other Ambulatory Visit: Payer: Self-pay

## 2020-07-13 DIAGNOSIS — R41841 Cognitive communication deficit: Secondary | ICD-10-CM | POA: Insufficient documentation

## 2020-07-13 DIAGNOSIS — R4701 Aphasia: Secondary | ICD-10-CM | POA: Diagnosis present

## 2020-07-13 NOTE — Therapy (Signed)
[facility] [address] [place], [place], [address] Phone: [phone]   Fax:  [phone]  Speech Language Pathology Evaluation  Patient Details  Name: Bradley Mcbride MRN: [id number] Date of Birth: 1950-07-19 Referring Provider (SLP): Sanfilippo, Saintclair Halsted, PA-C   Encounter Date: 07/13/2020   End of Session - 07/13/20 1115    Visit Number 1    Number of Visits 17    Date for SLP Re-Evaluation 10/11/20    Authorization Type Medicare    SLP Start Time 1015    SLP Stop Time  1057    SLP Time Calculation (min) 42 min    Activity Tolerance Patient tolerated treatment well           Past Medical History:  Diagnosis Date  . Atrial fibrillation ([place])   . Chronic anticoagulation   . Hyperlipidemia   . Tachycardia     Past Surgical History:  Procedure Laterality Date  . CARDIOVERSION N/A 10/06/2017   Procedure: CARDIOVERSION;  Surgeon: Fay Records, MD;  Location: [facility];  Service: Cardiovascular;  Laterality: N/A;  . ORBITAL FRACTURE SURGERY    . TEE WITHOUT CARDIOVERSION N/A 10/06/2017   Procedure: TRANSESOPHAGEAL ECHOCARDIOGRAM (TEE);  Surgeon: Fay Records, MD;  Location: [facility];  Service: Cardiovascular;  Laterality: N/A;  . UMBILICAL HERNIA REPAIR      There were no vitals filed for this visit.       SLP Evaluation [facility] - 07/13/20 1011      SLP Visit Information   SLP Received On 07/07/20    Referring Provider (SLP) [name], PA-C    Onset Date October 2021    Medical Diagnosis Primary Progressive Aphasia      Subjective   Patient/Family Stated Goal to improve communication      General Information   HPI 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 His wife reported to MD that he has been about the same since last seen in 11/2019. [name] informs that the process of forming an idea and expressing his thoughts is his biggest  concern, especially when agitated. He denies receiving speech therapy in the past. His wife adds that the concept of time is an issue as well. She states that if he is feeling rushed to do something, then it "goes out the window", however if he is taking his time and is not feeling stressed, then it is not as bad. [name] confirms that he does forget things, such as items from the store, even with a list. He states that he very set in his routine with taking his medication, he takes his medications at 9AM every morning and 9PM right before bedtime. His wife reports that he has handled the finances, however he may over look paying a bill. She informs that he was still handling the finances in 02/2020 before they went to [place] and since then she currently handles all of the finances. [name] confirms that he can do simple transaction at the store.      Balance Screen   Has the patient fallen in the past 6 months Yes    How many times? 1- slipped after mowing    Has the patient had a decrease in activity level because of a fear of falling?  No    Is the patient reluctant to leave their home because of a fear of falling?  No      Prior Functional  Status   Cognitive/Linguistic Baseline Within functional limits    Type of Home House     Lives With Spouse    Available Support Family    Education Bachelor's    Vocation Retired   Paramedic   Overall Cognitive Status Impaired/Different from baseline   MCI, PPA   Area of Impairment Memory;Attention;Awareness      Auditory Comprehension   Overall Auditory Comprehension Appears within functional limits for tasks assessed      Verbal Expression   Overall Verbal Expression Impaired    Level of Generative/Spontaneous Verbalization Conversation    Naming Impairment    Responsive 51-75% accurate    Confrontation 50-74% accurate    Convergent Not tested    Divergent Not tested    Other Naming Comments usual anomia, halting     Effective Techniques Semantic cues;Sentence completion;Phonemic cues      Written Expression   Dominant Hand Right      Oral Motor/Sensory Function   Overall Oral Motor/Sensory Function Impaired    Lingual ROM Reduced right;Reduced left    Lingual Symmetry Within Functional Limits    Lingual Coordination Reduced    Facial ROM Within Functional Limits      Motor Speech   Overall Motor Speech Impaired    Respiration Within functional limits    Phonation Normal    Intelligibility Intelligibility reduced    Conversation 75-100% accurate      Standardized Assessments   Standardized Assessments  Other Assessment   Quick Aphasia Battery   Other Assessment Quick Aphasia Battery= 8.63 (mild) mod word finding                           SLP Education - 07/13/20 1115    Education Details eval results, possible goals, anomia compensations    Person(s) Educated Patient    Methods Explanation;Demonstration;Handout    Comprehension Verbalized understanding;Returned demonstration;Need further instruction            SLP Short Term Goals - 07/13/20 1128      SLP SHORT TERM GOAL #1   Title Pt will verbally generate 5-8 items in personally relevant categories with occasional min A over 2 sessions    Time 4    Period Weeks   or 9 visits for all STGs   Status New      SLP SHORT TERM GOAL #2   Title Pt will use mulitmodal communication (gesture, draw, write 1st letter etc) to augment verbal expression with occasional min A over 2 sessions    Time 4    Period Weeks    Status New      SLP SHORT TERM GOAL #3   Title Pt will discuss topics of interest in 5-10 minute simple conversation with use of compensations given occasional min A over 2 sessions    Time 4    Period Weeks    Status New      SLP SHORT TERM GOAL #4   Title Pt's caregiver will appropriately cue patient when word finding episodes occur with occasional min A over 2 sessions    Time 4    Period Weeks     Status New            SLP Long Term Goals - 07/13/20 1131      SLP LONG TERM GOAL #1   Title Pt will use mulitmodal communication (gesture, draw, write 1st letter etc) to augment  verbal expression to meet needs at home with rare min A from family    Time 8    Period Weeks   or 17 total visits for all LTGs   Status New      SLP LONG TERM GOAL #2   Title Caregivers will appropriately cue patient and augment verbal expression when needed using recommended strategies given rare min A over 2 sessions    Time 8    Period Weeks      SLP LONG TERM GOAL #3   Title Pt will report reduced frustration and improved communication effectiveness via PROM by 3 points by last ST session    Time 8    Period Weeks    Status New            Plan - 07/13/20 1117    Clinical Impression Statement "[name]" was referred for OPST evaluation to assess aphasia related to primary progressive aphasia. 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 Pt reports greater concern for aphasia versus memory at this time. Seemingly adequate functional recall demonstrated this session, with good recall of recent events, medication changes, and accurate completion of tasks at home reported. Usual anomia exhibited in conversation as pt noted with frequent halting and circumlocution. Pt stated "my wife normally fills in (the word) for me." Pt endorsed he is sometimes able to ID targeted word with additional processing time. Frustration related to word finding indicated and demonstrated this session. Quick Aphasia Battery completed, which revealed overall mild aphasia. Moderate word finding noted during connected speech and picture naming tasks. Pt able to independently demo use of gesture and synonym strategies during evaluation. Mild lingual incoordination exhibited, in which pt stated "I pause a lot and sometimes it doesn't come out clear." No overt changes  in speech intelligiblity exhibited this session. Cognition and dysarthria may need to be monitored and/or further assessed during ST intervention. Skilled ST intervention is recommended to address mild aphasia to maximize current communication abilities as well as provide caregiver education to assist communication as PPA progresses.    Speech Therapy Frequency 2x / week    Duration 8 weeks   or 17 total visits   Treatment/Interventions Compensatory strategies;Patient/family education;Functional tasks;Cueing hierarchy;Cognitive reorganization;Multimodal communcation approach;SLP instruction and feedback;Internal/external aids;Compensatory techniques;Language facilitation    Potential to Achieve Goals Fair    Potential Considerations Medical prognosis    SLP Home Exercise Plan provided    Consulted and Agree with Plan of Care Patient           Patient will benefit from skilled therapeutic intervention in order to improve the following deficits and impairments:   Aphasia  Cognitive communication deficit    Problem List Patient Active Problem List   Diagnosis Date Noted  . Changing skin lesion 02/25/2020  . Paroxysmal atrial fibrillation ([place]) 12/31/2019  . Elevated bilirubin 05/26/2019  . Chronic anticoagulation   . Tachycardia   . Atrial fibrillation/flutter 05/24/2019  . Atrial flutter ([place]) 10/05/2017    Alinda Deem, MA CCC-SLP 07/13/2020, 12:22 PM  [facility] [address] [place], [place], [address] Phone: [phone]   Fax:  [phone]  Name: TYGE SOMERS MRN: [id number] Date of Birth: Jun 10, 1950

## 2020-07-13 NOTE — Patient Instructions (Signed)

## 2020-07-22 ENCOUNTER — Other Ambulatory Visit: Payer: Self-pay

## 2020-07-22 ENCOUNTER — Encounter: Payer: Self-pay | Admitting: Speech Pathology

## 2020-07-22 ENCOUNTER — Ambulatory Visit: Payer: Medicare Other | Attending: Physician Assistant | Admitting: Speech Pathology

## 2020-07-22 DIAGNOSIS — R4701 Aphasia: Secondary | ICD-10-CM

## 2020-07-22 DIAGNOSIS — R41841 Cognitive communication deficit: Secondary | ICD-10-CM | POA: Diagnosis present

## 2020-07-22 NOTE — Therapy (Signed)
[facility] [address] [place], [place], [address] Phone: [phone]   Fax:  [phone]  Speech Language Pathology Treatment  Patient Details  Name: Bradley Mcbride MRN: [id number] Date of Birth: [birth year] Referring Provider (SLP): Sanfilippo, Saintclair Halsted, PA-C   Encounter Date: 07/22/2020   End of Session - 07/22/20 1206    Visit Number 2    Number of Visits 17    Date for SLP Re-Evaluation 10/11/20    Authorization Type Medicare    SLP Start Time 4627    SLP Stop Time  1100    SLP Time Calculation (min) 45 min    Activity Tolerance Patient tolerated treatment well           Past Medical History:  Diagnosis Date  . Atrial fibrillation ([facility])   . Chronic anticoagulation   . Hyperlipidemia   . Tachycardia     Past Surgical History:  Procedure Laterality Date  . CARDIOVERSION N/A 10/06/2017   Procedure: CARDIOVERSION;  Surgeon: Fay Records, MD;  Location: [facility];  Service: Cardiovascular;  Laterality: N/A;  . ORBITAL FRACTURE SURGERY    . TEE WITHOUT CARDIOVERSION N/A 10/06/2017   Procedure: TRANSESOPHAGEAL ECHOCARDIOGRAM (TEE);  Surgeon: Fay Records, MD;  Location: [facility];  Service: Cardiovascular;  Laterality: N/A;  . UMBILICAL HERNIA REPAIR      There were no vitals filed for this visit.   Subjective Assessment - 07/22/20 1020    Subjective "I am OK"    Currently in Pain? No/denies                 ADULT SLP TREATMENT - 07/22/20 1020      General Information   Behavior/Cognition Alert;Cooperative;Pleasant mood      Treatment Provided   Treatment provided Cognitive-Linquistic      Cognitive-Linquistic Treatment   Treatment focused on Aphasia;Patient/family/caregiver education    Skilled Treatment Pt reports no difficulties over the phone and is not avoiding family or business calls. He denies difficulty communicating a church. Initiated generating communication  sheet/pages to augment Bradley Mcbride's language. Generated strategy of "pre-planning" writing down and reviewing names, pertinent info and topics prior to making a call or in person visit. Bradley Mcbride reports frustration forgetting to do a chore his spouse, Bradley Mcbride, asks him to go. Generatee strategy of using a notepad or white board to write down his list of chores so he can refer to this and cross off chore when it is complete. Encouraged Bradley Mcbride to have Bradley Mcbride attend sessions as she is able. Reviewed compensations for aphasia he was provided last session, specificaly synonyms and descriptions. In simple conversation, he used these strategies 4x with occasional questioning cues. Each time he used a strategy, reinforcement was provided.      Assessment / Recommendations / Plan   Plan Continue with current plan of care      Progression Toward Goals   Progression toward goals Progressing toward goals            SLP Education - 07/22/20 1202    Education Details external supports for aphasia; generate list of people, places activities, compensations for memory    Person(s) Educated Patient    Methods Explanation;Demonstration;Handout    Comprehension Verbalized understanding;Returned demonstration;Need further instruction;Verbal cues required            SLP Short Term Goals - 07/22/20 1205      SLP SHORT TERM GOAL #1   Title Pt will verbally generate 5-8 items  in personally relevant categories with occasional min A over 2 sessions    Time 4    Period Weeks   or 9 visits for all STGs   Status On-going      SLP SHORT TERM GOAL #2   Title Pt will use mulitmodal communication (gesture, draw, write 1st letter etc) to augment verbal expression with occasional min A over 2 sessions    Time 4    Period Weeks    Status On-going      SLP SHORT TERM GOAL #3   Title Pt will discuss topics of interest in 5-10 minute simple conversation with use of compensations given occasional min A over 2 sessions    Time 4     Period Weeks    Status On-going      SLP SHORT TERM GOAL #4   Title Pt's caregiver will appropriately cue patient when word finding episodes occur with occasional min A over 2 sessions    Time 4    Period Weeks    Status On-going            SLP Long Term Goals - 07/22/20 1205      SLP LONG TERM GOAL #1   Title Pt will use mulitmodal communication (gesture, draw, write 1st letter etc) to augment verbal expression to meet needs at home with rare min A from family    Time 8    Period Weeks   or 17 total visits for all LTGs   Status On-going      SLP LONG TERM GOAL #2   Title Caregivers will appropriately cue patient and augment verbal expression when needed using recommended strategies given rare min A over 2 sessions    Time 8    Period Weeks    Status On-going      SLP LONG TERM GOAL #3   Title Pt will report reduced frustration and improved communication effectiveness via PROM by 3 points by last ST session    Time 8    Period Weeks    Status On-going            Plan - 07/22/20 1203    Clinical Impression Statement Bradley Mcbride continues to present with 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 Initiated compensations for memory for daily chores. Ongoing generation of communication book to augment language now and as aphasia progresses. Ongoing training of caregiver cueing and support required. Continue skilled ST to maximize communication, including multimodal communication  and educate pt and family as PPA progreeses    Speech Therapy Frequency 2x / week    Duration 8 weeks   17 visits   Treatment/Interventions Compensatory strategies;Patient/family education;Functional tasks;Cueing hierarchy;Cognitive reorganization;Multimodal communcation approach;SLP instruction and feedback;Internal/external aids;Compensatory techniques;Language facilitation    Potential to Achieve Goals Fair    Potential Considerations Medical prognosis           Patient  will benefit from skilled therapeutic intervention in order to improve the following deficits and impairments:   Aphasia  Cognitive communication deficit    Problem List Patient Active Problem List   Diagnosis Date Noted  . Changing skin lesion 02/25/2020  . Paroxysmal atrial fibrillation ([facility]) 12/31/2019  . Elevated bilirubin 05/26/2019  . Chronic anticoagulation   . Tachycardia   . Atrial fibrillation/flutter 05/24/2019  . Atrial flutter ([facility]) 10/05/2017    Minie Roadcap, Annye Rusk MS, CCC-SLP 07/22/2020, 12:08 PM  [facility] [address] [place], [place], [address]  Phone: [phone]   Fax:  [phone]   Name: TANVEER BRAMMER MRN: [id number] Date of Birth: 19-Nov-1950

## 2020-07-22 NOTE — Patient Instructions (Addendum)
   Consider white board where Bradley Mcbride can write down what she wants you to do  You can check it off after you complete the chore  Write down names, jobs, kids' names, recent vacations - any topics you want to talk about and practice before you get together or make a phone call  Use a gesture to indicate that you need more time (hold up a finger or hand) or let   Get the Don's attention before you speak  Use eye contact and face the person you are speaking to  Be in close proximity to the person you are speaking to  Turn down any noise in the environment such as the TV, walk away from loud appliances, air conditioners, fans, dish washers etc  Educate family and friends to give you extra time to get your words out in conversation - Bradley Mcbride can help if you get interrupted   Make a list of people you talk to everyday, once a week, once a month  Make a list of places you frequent weekly and monthly  We will work on improving your language, as well as finding some supports and compensations for aphasia

## 2020-07-24 ENCOUNTER — Other Ambulatory Visit: Payer: Self-pay

## 2020-07-24 ENCOUNTER — Ambulatory Visit: Payer: Medicare Other

## 2020-07-24 DIAGNOSIS — R4701 Aphasia: Secondary | ICD-10-CM | POA: Diagnosis not present

## 2020-07-24 DIAGNOSIS — R41841 Cognitive communication deficit: Secondary | ICD-10-CM

## 2020-07-24 NOTE — Therapy (Signed)
[facility] [address] [place], [place], [address] Phone: [phone]   Fax:  [phone]  Speech Language Pathology Treatment  Patient Details  Name: Bradley Mcbride MRN: [id number] Date of Birth: June 13, 1950 Referring Provider (SLP): Sanfilippo, Saintclair Halsted, PA-C   Encounter Date: 07/24/2020   End of Session - 07/24/20 1229    Visit Number 3    Number of Visits 17    Date for SLP Re-Evaluation 10/11/20    Authorization Type Medicare    SLP Start Time 3428    SLP Stop Time  1320    SLP Time Calculation (min) 50 min    Activity Tolerance Patient tolerated treatment well           Past Medical History:  Diagnosis Date  . Atrial fibrillation ([facility] [place])   . Chronic anticoagulation   . Hyperlipidemia   . Tachycardia     Past Surgical History:  Procedure Laterality Date  . CARDIOVERSION N/A 10/06/2017   Procedure: CARDIOVERSION;  Surgeon: Fay Records, MD;  Location: [facility];  Service: Cardiovascular;  Laterality: N/A;  . ORBITAL FRACTURE SURGERY    . TEE WITHOUT CARDIOVERSION N/A 10/06/2017   Procedure: TRANSESOPHAGEAL ECHOCARDIOGRAM (TEE);  Surgeon: Fay Records, MD;  Location: [facility];  Service: Cardiovascular;  Laterality: N/A;  . UMBILICAL HERNIA REPAIR      There were no vitals filed for this visit.   Subjective Assessment - 07/24/20 1230    Subjective "It was good"    Currently in Pain? No/denies                 ADULT SLP TREATMENT - 07/24/20 1229      General Information   Behavior/Cognition Alert;Cooperative;Pleasant mood      Treatment Provided   Treatment provided Cognitive-Linquistic      Cognitive-Linquistic Treatment   Treatment focused on Aphasia;Patient/family/caregiver education    Skilled Treatment Pt and wife, Bradley Mcbride, attended this session. SLP reviewed recommendations to augument verbal expression from last session and rationale of recommendations related to  progression of PPA. Wife reports some patient frustration related to communication and occasional memory issues. Written to-do lists already in place at home. SLP targeted naming weekly outings/shops, in which pt able to name 6 items with occasional use of description strategy (ex: pt named items at store, landmarks). SLP provided occasional min semantic and phonemic cues to aid word finding, which was effective. SLP educated patient and wife on cueing hierachy and how to appropriately cue versus giving patient the targeted word. SLP encouraged other communication oportunities, including joining local boccee group. Pt and wife to complete communication PROM measures at home and return to therapy.      Assessment / Recommendations / Plan   Plan Continue with current plan of care      Progression Toward Goals   Progression toward goals Progressing toward goals            SLP Education - 07/24/20 1336    Education Details cueing hierachy, caregiver education re: progression of PPA    Person(s) Educated Patient;Spouse    Methods Explanation;Demonstration;Handout    Comprehension Verbalized understanding;Returned demonstration;Need further instruction            SLP Short Term Goals - 07/24/20 1229      SLP SHORT TERM GOAL #1   Title Pt will verbally generate 5-8 items in personally relevant categories with occasional min A over 2 sessions    Baseline 07-24-20 (weekly shops)  Time 4    Period Weeks   or 9 visits for all STGs   Status On-going      SLP SHORT TERM GOAL #2   Title Pt will use mulitmodal communication (gesture, draw, write 1st letter etc) to augment verbal expression with occasional min A over 2 sessions    Time 4    Period Weeks    Status On-going      SLP SHORT TERM GOAL #3   Title Pt will discuss topics of interest in 5-10 minute simple conversation with use of compensations given occasional min A over 2 sessions    Baseline 07-23-20    Time 4    Period Weeks     Status On-going      SLP SHORT TERM GOAL #4   Title Pt's caregiver will appropriately cue patient when word finding episodes occur with occasional min A over 2 sessions    Time 4    Period Weeks    Status On-going            SLP Long Term Goals - 07/24/20 1229      SLP LONG TERM GOAL #1   Title Pt will use mulitmodal communication (gesture, draw, write 1st letter etc) to augment verbal expression to meet needs at home with rare min A from family    Time 8    Period Weeks   or 17 total visits for all LTGs   Status On-going      SLP LONG TERM GOAL #2   Title Caregivers will appropriately cue patient and augment verbal expression when needed using recommended strategies given rare min A over 2 sessions    Time 8    Period Weeks    Status On-going      SLP LONG TERM GOAL #3   Title Pt will report reduced frustration and improved communication effectiveness via PROM by 3 points by last ST session    Time 8    Period Weeks    Status On-going            Plan - 07/24/20 1337    Clinical Impression Statement Bradley Mcbride continues to present with 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 SLP educated patient and wife on cueing hierachy and rationale for communication aids as PPA progresses. Pt able to name in conversation with occasional min semantic and phonemic cues.  Continue skilled ST to maximize communication, including multimodal communication and educate pt and family as PPA progreeses    Speech Therapy Frequency 2x / week    Duration 8 weeks   or 17 total visits   Treatment/Interventions Compensatory strategies;Patient/family education;Functional tasks;Cueing hierarchy;Cognitive reorganization;Multimodal communcation approach;SLP instruction and feedback;Internal/external aids;Compensatory techniques;Language facilitation    Potential to Achieve Goals Fair    Potential Considerations Medical prognosis    SLP Home Exercise Plan provided    Consulted and  Agree with Plan of Care Patient;Family member/caregiver           Patient will benefit from skilled therapeutic intervention in order to improve the following deficits and impairments:   Aphasia  Cognitive communication deficit    Problem List Patient Active Problem List   Diagnosis Date Noted  . Changing skin lesion 02/25/2020  . Paroxysmal atrial fibrillation ([place]) 12/31/2019  . Elevated bilirubin 05/26/2019  . Chronic anticoagulation   . Tachycardia   . Atrial fibrillation/flutter 05/24/2019  . Atrial flutter ([place]) 10/05/2017    Alinda Deem, MA CCC-SLP 07/24/2020, 1:44 PM  [facility] [address] [place], [place], [address] Phone: [phone]   Fax:  [phone]   Name: Bradley Mcbride MRN: [id number] Date of Birth: 04/07/1950

## 2020-07-27 ENCOUNTER — Other Ambulatory Visit: Payer: Self-pay

## 2020-07-27 ENCOUNTER — Ambulatory Visit: Payer: Medicare Other

## 2020-07-27 DIAGNOSIS — R4701 Aphasia: Secondary | ICD-10-CM

## 2020-07-27 DIAGNOSIS — R41841 Cognitive communication deficit: Secondary | ICD-10-CM

## 2020-07-27 NOTE — Therapy (Signed)
[facility] [address] [place], [place], [address] Phone: [phone]   Fax:  [phone]  Speech Language Pathology Treatment  Patient Details  Name: Bradley Mcbride MRN: [id number] Date of Birth: 25-Mar-1950 Referring Provider (SLP): Sanfilippo, Saintclair Halsted, PA-C   Encounter Date: 07/27/2020   End of Session - 07/27/20 1101    Visit Number 4    Number of Visits 17    Date for SLP Re-Evaluation 10/11/20    Authorization Type Medicare    SLP Start Time 1018    SLP Stop Time  1100    SLP Time Calculation (min) 42 min    Activity Tolerance Patient tolerated treatment well           Past Medical History:  Diagnosis Date  . Atrial fibrillation ([place])   . Chronic anticoagulation   . Hyperlipidemia   . Tachycardia     Past Surgical History:  Procedure Laterality Date  . CARDIOVERSION N/A 10/06/2017   Procedure: CARDIOVERSION;  Surgeon: Fay Records, MD;  Location: [facility];  Service: Cardiovascular;  Laterality: N/A;  . ORBITAL FRACTURE SURGERY    . TEE WITHOUT CARDIOVERSION N/A 10/06/2017   Procedure: TRANSESOPHAGEAL ECHOCARDIOGRAM (TEE);  Surgeon: Fay Records, MD;  Location: [facility];  Service: Cardiovascular;  Laterality: N/A;  . UMBILICAL HERNIA REPAIR      There were no vitals filed for this visit.   Subjective Assessment - 07/27/20 1016    Subjective "it took me a couple hours" re: HWK    Currently in Pain? Yes    Pain Score 3     Pain Location Neck    Pain Orientation Mid    Pain Descriptors / Indicators Sore    Pain Onset More than a month ago    Pain Frequency Intermittent                 ADULT SLP TREATMENT - 07/27/20 1016      General Information   Behavior/Cognition Alert;Cooperative;Pleasant mood      Treatment Provided   Treatment provided Cognitive-Linquistic      Cognitive-Linquistic Treatment   Treatment focused on Aphasia;Patient/family/caregiver education     Skilled Treatment Pt endorsed he spent "hours" working on naming homework targeting use of description strategy. Usual fading to min prompting required to expand responses to fully describe object (ex: book= "read" to "tells a story and is bound"). Pt required usual additional processing time and independently used gestures x2. PROM completed over weekend with the following scores: Comm SF: 16 & CES= 19, indicating less effective when communicating over telephone, in noisy environments, when emotionally upset, at a distance, and organizing thoughts.      Assessment / Recommendations / Plan   Plan Continue with current plan of care      Progression Toward Goals   Progression toward goals Progressing toward goals            SLP Education - 07/27/20 1204    Education Details how to maximize use of compensations    Person(s) Educated Patient    Methods Explanation;Demonstration;Handout    Comprehension Verbalized understanding;Returned demonstration;Need further instruction            SLP Short Term Goals - 07/27/20 1204      SLP SHORT TERM GOAL #1   Title Pt will verbally generate 5-8 items in personally relevant categories with occasional min A over 2 sessions    Baseline 07-24-20 (weekly shops)  Time 3    Period Weeks   or 9 visits for all STGs   Status On-going      SLP SHORT TERM GOAL #2   Title Pt will use mulitmodal communication (gesture, draw, write 1st letter etc) to augment verbal expression with occasional min A over 2 sessions    Baseline 07-27-20    Time 3    Period Weeks    Status On-going      SLP SHORT TERM GOAL #3   Title Pt will discuss topics of interest in 5-10 minute simple conversation with use of compensations given occasional min A over 2 sessions    Baseline 07-23-20    Time 3    Period Weeks    Status On-going      SLP SHORT TERM GOAL #4   Title Pt's caregiver will appropriately cue patient when word finding episodes occur with occasional min A over  2 sessions    Time 3    Period Weeks    Status On-going            SLP Long Term Goals - 07/27/20 1204      SLP LONG TERM GOAL #1   Title Pt will use mulitmodal communication (gesture, draw, write 1st letter etc) to augment verbal expression to meet needs at home with rare min A from family    Time 7    Period Weeks   or 17 total visits for all LTGs   Status On-going      SLP LONG TERM GOAL #2   Title Caregivers will appropriately cue patient and augment verbal expression when needed using recommended strategies given rare min A over 2 sessions    Time 7    Period Weeks    Status On-going      SLP LONG TERM GOAL #3   Title Pt will report reduced frustration and improved communication effectiveness via PROM by 3 points by last ST session    Baseline Comm SF= 16 & CES= 19    Time 7    Period Weeks    Status On-going            Plan - 07/27/20 1205    Clinical Impression Statement Bradley Mcbride continues to present with 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 SLP educated patient on how to maximize use of aphasia compensations to cue self and listener, in which pt able to demo with usual fading to min prompting. See "skilled treatment" for additional details of today's session. Continue skilled ST to maximize communication, including multimodal communication and educate pt and family as PPA progresses    Speech Therapy Frequency 2x / week    Duration 8 weeks   or 17 total visits   Treatment/Interventions Compensatory strategies;Patient/family education;Functional tasks;Cueing hierarchy;Cognitive reorganization;Multimodal communcation approach;SLP instruction and feedback;Internal/external aids;Compensatory techniques;Language facilitation    Potential to Achieve Goals Fair    Potential Considerations Medical prognosis    SLP Home Exercise Plan provided    Consulted and Agree with Plan of Care Patient           Patient will benefit from skilled  therapeutic intervention in order to improve the following deficits and impairments:   Aphasia  Cognitive communication deficit    Problem List Patient Active Problem List   Diagnosis Date Noted  . Changing skin lesion 02/25/2020  . Paroxysmal atrial fibrillation ([place] [facility]) 12/31/2019  . Elevated bilirubin 05/26/2019  . Chronic anticoagulation   . Tachycardia   .  Atrial fibrillation/flutter 05/24/2019  . Atrial flutter ([place]) 10/05/2017    Alinda Deem, MA CCC-SLP 07/27/2020, 1:23 PM  [facility] [address] [place], [place], [address] Phone: [phone]   Fax:  [phone]   Name: Bradley Mcbride MRN: [id number] Date of Birth: 06/11/1950

## 2020-07-29 ENCOUNTER — Other Ambulatory Visit: Payer: Self-pay

## 2020-07-29 ENCOUNTER — Encounter: Payer: Self-pay | Admitting: Speech Pathology

## 2020-07-29 ENCOUNTER — Ambulatory Visit: Payer: Medicare Other | Admitting: Speech Pathology

## 2020-07-29 DIAGNOSIS — R4701 Aphasia: Secondary | ICD-10-CM

## 2020-07-29 DIAGNOSIS — R41841 Cognitive communication deficit: Secondary | ICD-10-CM

## 2020-07-29 NOTE — Therapy (Signed)
[facility] [address] [place], [place], [address] Phone: [phone]   Fax:  [phone]  Speech Language Pathology Treatment  Patient Details  Name: Bradley Mcbride MRN: [id number] Date of Birth: 20-Mar-1950 Referring Provider (SLP): Sanfilippo, Saintclair Halsted, PA-C   Encounter Date: 07/29/2020   End of Session - 07/29/20 1523    Visit Number 5    Number of Visits 17    Date for SLP Re-Evaluation 10/11/20    SLP Start Time 2952    SLP Stop Time  8413    SLP Time Calculation (min) 42 min    Activity Tolerance Patient tolerated treatment well           Past Medical History:  Diagnosis Date  . Atrial fibrillation ([place])   . Chronic anticoagulation   . Hyperlipidemia   . Tachycardia     Past Surgical History:  Procedure Laterality Date  . CARDIOVERSION N/A 10/06/2017   Procedure: CARDIOVERSION;  Surgeon: Fay Records, MD;  Location: [facility];  Service: Cardiovascular;  Laterality: N/A;  . ORBITAL FRACTURE SURGERY    . TEE WITHOUT CARDIOVERSION N/A 10/06/2017   Procedure: TRANSESOPHAGEAL ECHOCARDIOGRAM (TEE);  Surgeon: Fay Records, MD;  Location: [facility];  Service: Cardiovascular;  Laterality: N/A;  . UMBILICAL HERNIA REPAIR      There were no vitals filed for this visit.   Subjective Assessment - 07/29/20 1233    Subjective "I'm good"    Currently in Pain? No/denies    Pain Score Asleep                 ADULT SLP TREATMENT - 07/29/20 1235      General Information   Behavior/Cognition Alert;Cooperative;Pleasant mood      Treatment Provided   Treatment provided Cognitive-Linquistic      Cognitive-Linquistic Treatment   Treatment focused on Aphasia;Patient/family/caregiver education    Skilled Treatment Bradley Mcbride has not generated list of persoanlly relevant words for communication supports. Reviewed rationale (progression of aphasia) for generating some communication supports even though  he may not need them now. Added to communication page 4 friends, 2 restaurants and 3 stores. Sent note home to have [place] him with this. Targeted verbal compensations for aphasia generating salient descriptions for 5 words with occasional min questioning cues and direct instruction.      Assessment / Recommendations / Plan   Plan Continue with current plan of care      Progression Toward Goals   Progression toward goals Progressing toward goals            SLP Education - 07/29/20 1522    Education Details communication supports, compensations for aphasia    Person(s) Educated Patient    Methods Explanation;Demonstration;Verbal cues    Comprehension Returned demonstration;Verbal cues required;Need further instruction            SLP Short Term Goals - 07/29/20 1523      SLP SHORT TERM GOAL #1   Title Pt will verbally generate 5-8 items in personally relevant categories with occasional min A over 2 sessions    Baseline 07-24-20 (weekly shops)    Time 3    Period Weeks   or 9 visits for all STGs   Status On-going      SLP SHORT TERM GOAL #2   Title Pt will use mulitmodal communication (gesture, draw, write 1st letter etc) to augment verbal expression with occasional min A over 2 sessions    Baseline 07-27-20  Time 3    Period Weeks    Status On-going      SLP SHORT TERM GOAL #3   Title Pt will discuss topics of interest in 5-10 minute simple conversation with use of compensations given occasional min A over 2 sessions    Baseline 07-23-20    Time 3    Period Weeks    Status On-going      SLP SHORT TERM GOAL #4   Title Pt's caregiver will appropriately cue patient when word finding episodes occur with occasional min A over 2 sessions    Time 3    Period Weeks    Status On-going            SLP Long Term Goals - 07/29/20 1523      SLP LONG TERM GOAL #1   Title Pt will use mulitmodal communication (gesture, draw, write 1st letter etc) to augment verbal  expression to meet needs at home with rare min A from family    Time 7    Period Weeks   or 17 total visits for all LTGs   Status On-going      SLP LONG TERM GOAL #2   Title Caregivers will appropriately cue patient and augment verbal expression when needed using recommended strategies given rare min A over 2 sessions    Time 7    Period Weeks    Status On-going      SLP LONG TERM GOAL #3   Title Pt will report reduced frustration and improved communication effectiveness via PROM by 3 points by last ST session    Baseline Comm SF= 16 & CES= 19    Time 7    Period Weeks    Status On-going            Plan - 07/29/20 1523    Clinical Impression Statement Bradley Mcbride continues to present with 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 SLP educated patient on how to maximize use of aphasia compensations to cue self and listener, in which pt able to demo with usual fading to min prompting. See "skilled treatment" for additional details of today's session. Continue skilled ST to maximize communication, including multimodal communication and educate pt and family as PPA progresses    Speech Therapy Frequency 2x / week    Duration 8 weeks   17 visists   Treatment/Interventions Compensatory strategies;Patient/family education;Functional tasks;Cueing hierarchy;Cognitive reorganization;Multimodal communcation approach;SLP instruction and feedback;Internal/external aids;Compensatory techniques;Language facilitation    Potential to Achieve Goals Fair    Potential Considerations Medical prognosis           Patient will benefit from skilled therapeutic intervention in order to improve the following deficits and impairments:   Aphasia  Cognitive communication deficit    Problem List Patient Active Problem List   Diagnosis Date Noted  . Changing skin lesion 02/25/2020  . Paroxysmal atrial fibrillation ([facility]) 12/31/2019  . Elevated bilirubin 05/26/2019  . Chronic  anticoagulation   . Tachycardia   . Atrial fibrillation/flutter 05/24/2019  . Atrial flutter ([place]) 10/05/2017    Destine Ambroise, Annye Rusk MS, CCC-SLP 07/29/2020, 3:24 PM  [facility] [address] [place], [place], [address] Phone: [phone]   Fax:  [phone]   Name: Bradley Mcbride MRN: [id number] Date of Birth: 11/18/1950

## 2020-08-04 ENCOUNTER — Ambulatory Visit: Payer: Medicare Other | Admitting: Physician Assistant

## 2020-08-05 ENCOUNTER — Ambulatory Visit: Payer: Medicare Other | Admitting: Speech Pathology

## 2020-08-06 ENCOUNTER — Other Ambulatory Visit: Payer: Self-pay

## 2020-08-06 ENCOUNTER — Ambulatory Visit: Payer: Medicare Other

## 2020-08-06 DIAGNOSIS — R41841 Cognitive communication deficit: Secondary | ICD-10-CM

## 2020-08-06 DIAGNOSIS — R4701 Aphasia: Secondary | ICD-10-CM

## 2020-08-06 NOTE — Therapy (Signed)
[facility] [address] [place], [place], [address] Phone: [phone]   Fax:  [phone]  Speech Language Pathology Treatment  Patient Details  Name: Bradley Mcbride MRN: [id number] Date of Birth: 07-Mar-1950 Referring Provider (SLP): Sanfilippo, Saintclair Halsted, PA-C   Encounter Date: 08/06/2020   End of Session - 08/06/20 1017     Visit Number 6    Number of Visits 17    Date for SLP Re-Evaluation 10/11/20    Authorization Type Medicare    SLP Start Time 1017    SLP Stop Time  1100    SLP Time Calculation (min) 43 min    Activity Tolerance Patient tolerated treatment well             Past Medical History:  Diagnosis Date   Atrial fibrillation ([facility])    Chronic anticoagulation    Hyperlipidemia    Tachycardia     Past Surgical History:  Procedure Laterality Date   CARDIOVERSION N/A 10/06/2017   Procedure: CARDIOVERSION;  Surgeon: Fay Records, MD;  Location: [facility];  Service: Cardiovascular;  Laterality: N/A;   ORBITAL FRACTURE SURGERY     TEE WITHOUT CARDIOVERSION N/A 10/06/2017   Procedure: TRANSESOPHAGEAL ECHOCARDIOGRAM (TEE);  Surgeon: Fay Records, MD;  Location: [facility];  Service: Cardiovascular;  Laterality: N/A;   UMBILICAL HERNIA REPAIR      There were no vitals filed for this visit.   Subjective Assessment - 08/06/20 1017     Subjective "I did the homework aloud with [name]"    Currently in Pain? No/denies                   ADULT SLP TREATMENT - 08/06/20 1017       General Information   Behavior/Cognition Alert;Cooperative;Pleasant mood      Treatment Provided   Treatment provided Cognitive-Linquistic      Cognitive-Linquistic Treatment   Treatment focused on Aphasia;Patient/family/caregiver education    Skilled Treatment Pt did not add to communication page as instructed last ST session. Today, SLP prompted patient with topics and questions to add to  communication aid for future use. Pt noted with increased difficulty with last names, which pt identified with additional processing time. Usual halting and occasional anomia noted in conversation. Pt noted to use independently use description strategy to cue SLP re: favorite restaurant locations.      Assessment / Recommendations / Plan   Plan Continue with current plan of care      Progression Toward Goals   Progression toward goals Progressing toward goals              SLP Education - 08/06/20 1409     Education Details rationale for communication aid, HEP to add to comm book    Person(s) Educated Patient    Methods Explanation;Demonstration;Handout    Comprehension Verbalized understanding;Returned demonstration;Need further instruction              SLP Short Term Goals - 08/06/20 1021       SLP SHORT TERM GOAL #1   Title Pt will verbally generate 5-8 items in personally relevant categories with occasional min A over 2 sessions    Baseline 07-24-20 (weekly shops)    Time 2    Period Weeks   or 9 visits for all STGs   Status On-going      SLP SHORT TERM GOAL #2   Title Pt will use mulitmodal communication (gesture, draw, write 1st letter  etc) to augment verbal expression with occasional min A over 2 sessions    Baseline 07-27-20    Time 2    Period Weeks    Status On-going      SLP SHORT TERM GOAL #3   Title Pt will discuss topics of interest in 5-10 minute simple conversation with use of compensations given occasional min A over 2 sessions    Baseline 07-23-20, 08-06-20    Time --    Period --    Status Achieved      SLP SHORT TERM GOAL #4   Title Pt's caregiver will appropriately cue patient when word finding episodes occur with occasional min A over 2 sessions    Time 2    Period Weeks    Status On-going              SLP Long Term Goals - 08/06/20 1021       SLP LONG TERM GOAL #1   Title Pt will use mulitmodal communication (gesture, draw, write 1st  letter etc) to augment verbal expression to meet needs at home with rare min A from family    Time 6    Period Weeks   or 17 total visits for all LTGs   Status On-going      SLP LONG TERM GOAL #2   Title Caregivers will appropriately cue patient and augment verbal expression when needed using recommended strategies given rare min A over 2 sessions    Time 6    Period Weeks    Status On-going      SLP LONG TERM GOAL #3   Title Pt will report reduced frustration and improved communication effectiveness via PROM by 3 points by last ST session    Baseline Comm SF= 16 & CES= 19    Time 6    Period Weeks    Status On-going              Plan - 08/06/20 1104     Clinical Impression Statement Bradley Mcbride continues to present with 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 SLP educated patient on importance of communication aids due to nature of PPA. With mild to mod questioning cues and prompts, pt able to provided additional details re: friends and favorite restaurants. Good independent use of description strategy demonstrated this session to describe restaurant locations. See "skilled treatment" for additional details of today's session. Continue skilled ST to maximize communication, including multimodal communication and educate pt and family as PPA progresses    Speech Therapy Frequency 2x / week    Duration 8 weeks   or 17 total visits   Treatment/Interventions Compensatory strategies;Patient/family education;Functional tasks;Cueing hierarchy;Cognitive reorganization;Multimodal communcation approach;SLP instruction and feedback;Internal/external aids;Compensatory techniques;Language facilitation    Potential to Achieve Goals Fair    Potential Considerations Medical prognosis    SLP Home Exercise Plan provided    Consulted and Agree with Plan of Care Patient             Patient will benefit from skilled therapeutic intervention in order to improve the following  deficits and impairments:   Aphasia  Cognitive communication deficit    Problem List Patient Active Problem List   Diagnosis Date Noted   Changing skin lesion 02/25/2020   Paroxysmal atrial fibrillation ([facility]) 12/31/2019   Elevated bilirubin 05/26/2019   Chronic anticoagulation    Tachycardia    Atrial fibrillation/flutter 05/24/2019   Atrial flutter ([facility]) 10/05/2017    Alinda Deem, MA  CCC-SLP 08/06/2020, 2:11 PM  [facility] [address] [place], [place], [address] Phone: [phone]   Fax:  [phone]   Name: Bradley Mcbride MRN: [id number] Date of Birth: Jan 15, 1951

## 2020-08-06 NOTE — Patient Instructions (Signed)
Think of some other favorite restaurants, favorite stores, hobbies, etc to add to communication support book. Write them down below

## 2020-08-07 ENCOUNTER — Ambulatory Visit: Payer: Medicare Other

## 2020-08-07 DIAGNOSIS — R4701 Aphasia: Secondary | ICD-10-CM

## 2020-08-07 DIAGNOSIS — R41841 Cognitive communication deficit: Secondary | ICD-10-CM

## 2020-08-07 NOTE — Therapy (Signed)
[facility] [address] [place], [place], [address] Phone: [phone]   Fax:  [phone]  Speech Language Pathology Treatment  Patient Details  Name: Bradley Mcbride MRN: [id number] Date of Birth: 1950-10-10 Referring Provider (SLP): Sanfilippo, Saintclair Halsted, PA-C   Encounter Date: 08/07/2020   End of Session - 08/07/20 1206     Visit Number 7    Number of Visits 17    Date for SLP Re-Evaluation 10/11/20    Authorization Type Medicare    SLP Start Time 1017    SLP Stop Time  1100    SLP Time Calculation (min) 43 min    Activity Tolerance Patient tolerated treatment well             Past Medical History:  Diagnosis Date   Atrial fibrillation ([place])    Chronic anticoagulation    Hyperlipidemia    Tachycardia     Past Surgical History:  Procedure Laterality Date   CARDIOVERSION N/A 10/06/2017   Procedure: CARDIOVERSION;  Surgeon: Fay Records, MD;  Location: [facility];  Service: Cardiovascular;  Laterality: N/A;   ORBITAL FRACTURE SURGERY     TEE WITHOUT CARDIOVERSION N/A 10/06/2017   Procedure: TRANSESOPHAGEAL ECHOCARDIOGRAM (TEE);  Surgeon: Fay Records, MD;  Location: [facility];  Service: Cardiovascular;  Laterality: N/A;   UMBILICAL HERNIA REPAIR      There were no vitals filed for this visit.   Subjective Assessment - 08/07/20 1018     Subjective 'I'm a little hurried this morning"    Currently in Pain? No/denies                   ADULT SLP TREATMENT - 08/07/20 1019       General Information   Behavior/Cognition Alert;Cooperative;Pleasant mood      Treatment Provided   Treatment provided Cognitive-Linquistic      Cognitive-Linquistic Treatment   Treatment focused on Aphasia;Patient/family/caregiver education    Skilled Treatment HEP not completed as "power was out yesterday." SLP continued education and instruction to generate personalized communication book for PPA.  SLP facilitated conversation re: hobbies, vacations, and additional family members. Pt presented with usual halting indicating word finding; however, pt able to ID targeted word with ~90% accuracy given additional processing time. Pt noted to use descriptons, synonyms, and circumlocation as well to aid word finding. Pt provided rare phonemic cues and occasional questioning cues to facilitate descriptions.      Assessment / Recommendations / Plan   Plan Continue with current plan of care      Progression Toward Goals   Progression toward goals Progressing toward goals              SLP Education - 08/07/20 1206     Education Details different ideas to add to communication support book    Person(s) Educated Patient    Methods Explanation;Demonstration    Comprehension Verbalized understanding;Returned demonstration;Need further instruction              SLP Short Term Goals - 08/07/20 1206       SLP SHORT TERM GOAL #1   Title Pt will verbally generate 5-8 items in personally relevant categories with occasional min A over 2 sessions    Baseline 07-24-20 (weekly shops), 08-07-20    Period --   or 9 visits for all STGs   Status Achieved      SLP SHORT TERM GOAL #2   Title Pt will use mulitmodal communication (  gesture, draw, write 1st letter etc) to augment verbal expression with occasional min A over 2 sessions    Baseline 07-27-20    Time 2    Period Weeks    Status On-going      SLP SHORT TERM GOAL #3   Title Pt will discuss topics of interest in 5-10 minute simple conversation with use of compensations given occasional min A over 2 sessions    Baseline 07-23-20, 08-06-20    Status Achieved      SLP SHORT TERM GOAL #4   Title Pt's caregiver will appropriately cue patient when word finding episodes occur with occasional min A over 2 sessions    Time 2    Period Weeks    Status On-going              SLP Long Term Goals - 08/07/20 1207       SLP LONG TERM GOAL #1    Title Pt will use mulitmodal communication (gesture, draw, write 1st letter etc) to augment verbal expression to meet needs at home with rare min A from family    Time 6    Period Weeks   or 17 total visits for all LTGs   Status On-going      SLP LONG TERM GOAL #2   Title Caregivers will appropriately cue patient and augment verbal expression when needed using recommended strategies given rare min A over 2 sessions    Time 6    Period Weeks    Status On-going      SLP LONG TERM GOAL #3   Title Pt will report reduced frustration and improved communication effectiveness via PROM by 3 points by last ST session    Baseline Comm SF= 16 & CES= 19    Time 6    Period Weeks    Status On-going              Plan - 08/07/20 1207     Clinical Impression Statement Bradley Mcbride continues to present with 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 SLP educated patient on importance of communication aids due to nature of PPA. With occasional questioning cues and rare phonemic cues, pt able to provided additional details re: hobbies, travels, and family. Good independent use of description strategy, synonyms, and circumlocation noted in conversation. Pt benefited from usual additional processing time for word finding. See "skilled treatment" for additional details of today's session. Continue skilled ST to maximize communication, including multimodal communication and educate pt and family as PPA progresses    Speech Therapy Frequency 2x / week    Duration 8 weeks   or 17 total visits   Treatment/Interventions Compensatory strategies;Patient/family education;Functional tasks;Cueing hierarchy;Cognitive reorganization;Multimodal communcation approach;SLP instruction and feedback;Internal/external aids;Compensatory techniques;Language facilitation    Potential to Achieve Goals Fair    Potential Considerations Medical prognosis    SLP Home Exercise Plan provided    Consulted and Agree  with Plan of Care Patient             Patient will benefit from skilled therapeutic intervention in order to improve the following deficits and impairments:   Aphasia  Cognitive communication deficit    Problem List Patient Active Problem List   Diagnosis Date Noted   Changing skin lesion 02/25/2020   Paroxysmal atrial fibrillation ([facility]) 12/31/2019   Elevated bilirubin 05/26/2019   Chronic anticoagulation    Tachycardia    Atrial fibrillation/flutter 05/24/2019   Atrial flutter ([facility]) 10/05/2017  Alinda Deem, MA CCC-SLP 08/07/2020, 12:09 PM  [facility] [address] [place], [place], [address] Phone: [phone]   Fax:  [phone]   Name: Bradley Mcbride MRN: [id number] Date of Birth: [birth year]

## 2020-08-10 ENCOUNTER — Other Ambulatory Visit: Payer: Self-pay

## 2020-08-10 ENCOUNTER — Ambulatory Visit: Payer: Medicare Other | Admitting: Speech Pathology

## 2020-08-10 ENCOUNTER — Encounter: Payer: Self-pay | Admitting: Speech Pathology

## 2020-08-10 DIAGNOSIS — R41841 Cognitive communication deficit: Secondary | ICD-10-CM

## 2020-08-10 DIAGNOSIS — R4701 Aphasia: Secondary | ICD-10-CM

## 2020-08-10 NOTE — Patient Instructions (Signed)
Your Elenor Legato will not bathe alone  Establish 2 days a week that are bath days, such as every Monday and Thursday and write this down where she can see it (calendar)  Make the bathroom warm and comfortable  Give short instructions with a positive re-inforcement "Let's shower now, then we'll have a yummy"  Keep the conversation focused on the snack or fun activity she can do when she is done "Those cookies are your favorite, they will be so good"   If she gets angry, change the subject to something pleasant  Keep towels warm and easily accessible   Use hand held shower head

## 2020-08-10 NOTE — Therapy (Signed)
[facility] [address] [place], [place], [address] Phone: [phone]   Fax:  [phone]  Speech Language Pathology Treatment  Patient Details  Name: Bradley Mcbride MRN: [id number] Date of Birth: 1950-07-01 Referring Provider (SLP): Sanfilippo, Saintclair Halsted, PA-C   Encounter Date: 08/10/2020   End of Session - 08/10/20 1206     Visit Number 8    Number of Visits 17    Date for SLP Re-Evaluation 10/11/20    Authorization Type Medicare    SLP Start Time 1016    SLP Stop Time  1058    SLP Time Calculation (min) 42 min    Activity Tolerance Patient tolerated treatment well             Past Medical History:  Diagnosis Date   Atrial fibrillation ([facility])    Chronic anticoagulation    Hyperlipidemia    Tachycardia     Past Surgical History:  Procedure Laterality Date   CARDIOVERSION N/A 10/06/2017   Procedure: CARDIOVERSION;  Surgeon: Bradley Records, MD;  Location: [facility];  Service: Cardiovascular;  Laterality: N/A;   ORBITAL FRACTURE SURGERY     TEE WITHOUT CARDIOVERSION N/A 10/06/2017   Procedure: TRANSESOPHAGEAL ECHOCARDIOGRAM (TEE);  Surgeon: Bradley Records, MD;  Location: [facility];  Service: Cardiovascular;  Laterality: N/A;   UMBILICAL HERNIA REPAIR      There were no vitals filed for this visit.   Subjective Assessment - 08/10/20 1021     Subjective "It's coming" re: talking    Currently in Pain? No/denies                   ADULT SLP TREATMENT - 08/10/20 1158       General Information   Behavior/Cognition Alert;Cooperative;Pleasant mood      Treatment Provided   Treatment provided Cognitive-Linquistic      Cognitive-Linquistic Treatment   Treatment focused on Aphasia;Patient/family/caregiver education    Skilled Treatment Bradley Mcbride asked his wife about the restaurant list on his communication support pages. She agreed they didn't eat out much and did not add to list. Today,  targeted multimodal communication, verbal compensations for aphasia while adding 4 more stores and adding 12 meal/snack preferences to communication support pages. When Bradley Mcbride used gestures and some verbal descriptions, I provided positive feedback with how each specific compensation helped me understand what he meant and helped him find a word. Bradley Mcbride benefits from usual questioning cues to A him in eliciting salient description of word he is looking for. Bradley Mcbride spontaneously used his phone to A him in saying his barber's name. Again, I provided feedback that this strategy was successful in helping him with word finding. Encouraged use of  technology to A in compensating for aphasia.      Assessment / Recommendations / Plan   Plan Continue with current plan of care      Progression Toward Goals   Progression toward goals Progressing toward goals              SLP Education - 08/10/20 1204     Education Details pt's had questions on how to get his elderly aunt with dementia to bathe. Provided some strategies; Ongoing compensations for aphasia    Person(s) Educated Patient    Methods Explanation;Demonstration;Verbal cues;Handout    Comprehension Verbalized understanding;Returned demonstration;Verbal cues required;Need further instruction              SLP Short Term Goals - 08/10/20 1205  SLP SHORT TERM GOAL #1   Title Pt will verbally generate 5-8 items in personally relevant categories with occasional min A over 2 sessions    Baseline 07-24-20 (weekly shops), 08-07-20    Period --   or 9 visits for all STGs   Status Achieved      SLP SHORT TERM GOAL #2   Title Pt will use mulitmodal communication (gesture, draw, write 1st letter etc) to augment verbal expression with occasional min A over 2 sessions    Baseline 07-27-20; 08/10/20;    Time 1    Period Weeks    Status On-going      SLP SHORT TERM GOAL #3   Title Pt will discuss topics of interest in 5-10 minute simple conversation with  use of compensations given occasional min A over 2 sessions    Baseline 07-23-20, 08-06-20    Status Achieved      SLP SHORT TERM GOAL #4   Title Pt's caregiver will appropriately cue patient when word finding episodes occur with occasional min A over 2 sessions    Time 2    Period Weeks    Status On-going              SLP Long Term Goals - 08/10/20 1206       SLP LONG TERM GOAL #1   Title Pt will use mulitmodal communication (gesture, draw, write 1st letter etc) to augment verbal expression to meet needs at home with rare min A from family    Time 5    Period Weeks   or 17 total visits for all LTGs   Status On-going      SLP LONG TERM GOAL #2   Title Caregivers will appropriately cue patient and augment verbal expression when needed using recommended strategies given rare min A over 2 sessions    Time 5    Period Weeks    Status On-going      SLP LONG TERM GOAL #3   Title Pt will report reduced frustration and improved communication effectiveness via PROM by 3 points by last ST session    Baseline Comm SF= 16 & CES= 19    Time 5    Period Weeks    Status On-going              Plan - 08/10/20 1205     Clinical Impression Statement Bradley Mcbride continues to present with 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 SLP educated patient on importance of communication aids due to nature of PPA. With occasional questioning cues and rare phonemic cues, pt able to provided additional details re: hobbies, travels, and family. Good independent use of description strategy, synonyms, and circumlocation noted in conversation. Pt benefited from usual additional processing time for word finding. See "skilled treatment" for additional details of today's session. Continue skilled ST to maximize communication, including multimodal communication and educate pt and family as PPA progresses    Speech Therapy Frequency 2x / week    Duration 8 weeks   17 visits    Treatment/Interventions Compensatory strategies;Patient/family education;Functional tasks;Cueing hierarchy;Cognitive reorganization;Multimodal communcation approach;SLP instruction and feedback;Internal/external aids;Compensatory techniques;Language facilitation    Potential to Achieve Goals Fair    Potential Considerations Medical prognosis             Patient will benefit from skilled therapeutic intervention in order to improve the following deficits and impairments:   Aphasia  Cognitive communication deficit    Problem List Patient Active  Problem List   Diagnosis Date Noted   Changing skin lesion 02/25/2020   Paroxysmal atrial fibrillation ([facility]) 12/31/2019   Elevated bilirubin 05/26/2019   Chronic anticoagulation    Tachycardia    Atrial fibrillation/flutter 05/24/2019   Atrial flutter ([facility]) 10/05/2017    Jemell Town, Annye Rusk MS,  CCC-SLP 08/10/2020, 12:07 PM  [facility] [address] [place], [place], [address] Phone: [phone]   Fax:  [phone]   Name: CHAPMAN MATTEUCCI MRN: [id number] Date of Birth: May 01, 1950

## 2020-08-12 ENCOUNTER — Other Ambulatory Visit: Payer: Self-pay

## 2020-08-12 ENCOUNTER — Ambulatory Visit: Payer: Medicare Other

## 2020-08-12 DIAGNOSIS — R4701 Aphasia: Secondary | ICD-10-CM | POA: Diagnosis not present

## 2020-08-12 DIAGNOSIS — R41841 Cognitive communication deficit: Secondary | ICD-10-CM

## 2020-08-12 NOTE — Therapy (Signed)
[facility] [address] [place], [place], [place], [address] Phone: [phone]   Fax:  [phone]  Speech Language Pathology Treatment  Patient Details  Name: Bradley Mcbride MRN: [id number] Date of Birth: 08-Jan-1951 Referring Provider (SLP): Sanfilippo, Saintclair Halsted, PA-C   Encounter Date: 08/12/2020   End of Session - 08/12/20 1029     Visit Number 9    Number of Visits 17    Date for SLP Re-Evaluation 10/11/20    Authorization Type Medicare    SLP Start Time 1021    SLP Stop Time  1100    SLP Time Calculation (min) 39 min    Activity Tolerance Patient tolerated treatment well             Past Medical History:  Diagnosis Date   Atrial fibrillation ([facility])    Chronic anticoagulation    Hyperlipidemia    Tachycardia     Past Surgical History:  Procedure Laterality Date   CARDIOVERSION N/A 10/06/2017   Procedure: CARDIOVERSION;  Surgeon: Fay Records, MD;  Location: [facility];  Service: Cardiovascular;  Laterality: N/A;   ORBITAL FRACTURE SURGERY     TEE WITHOUT CARDIOVERSION N/A 10/06/2017   Procedure: TRANSESOPHAGEAL ECHOCARDIOGRAM (TEE);  Surgeon: Fay Records, MD;  Location: [facility];  Service: Cardiovascular;  Laterality: N/A;   UMBILICAL HERNIA REPAIR      There were no vitals filed for this visit.   Subjective Assessment - 08/12/20 1023     Subjective "I took a walk this morning"    Currently in Pain? No/denies                   ADULT SLP TREATMENT - 08/12/20 1023       General Information   Behavior/Cognition Alert;Cooperative;Pleasant mood      Treatment Provided   Treatment provided Cognitive-Linquistic      Cognitive-Linquistic Treatment   Treatment focused on Aphasia;Patient/family/caregiver education    Skilled Treatment SLP engaged pt in further discussion and creation of communication support. Discussion focused on naming of medical professionals, service  providers/personnel, and pertinent medical hx. Pt benefitted from additional processing time for naming. Usual use of descriptions exhibited, particularly when describing locations. Occasional semantic and phonemic cues required to name specific names related to roads and types of medical services. Pt endorses improved ease of naming with additional time this session.      Assessment / Recommendations / Plan   Plan Continue with current plan of care      Progression Toward Goals   Progression toward goals Progressing toward goals              SLP Education - 08/12/20 1028     Education Details communication support and strategies    Person(s) Educated Patient    Methods Explanation;Demonstration;Handout    Comprehension Verbalized understanding;Returned demonstration;Need further instruction              SLP Short Term Goals - 08/12/20 1030       SLP SHORT TERM GOAL #1   Title Pt will verbally generate 5-8 items in personally relevant categories with occasional min A over 2 sessions    Baseline 07-24-20 (weekly shops), 08-07-20    Period --   or 9 visits for all STGs   Status Achieved      SLP SHORT TERM GOAL #2   Title Pt will use mulitmodal communication (gesture, draw, write 1st letter etc) to augment verbal expression with occasional  min A over 2 sessions    Baseline 07-27-20; 08/10/20    Status Achieved      SLP SHORT TERM GOAL #3   Title Pt will discuss topics of interest in 5-10 minute simple conversation with use of compensations given occasional min A over 2 sessions    Baseline 07-23-20, 08-06-20    Status Achieved      SLP SHORT TERM GOAL #4   Title Pt's caregiver will appropriately cue patient when word finding episodes occur with occasional min A over 2 sessions    Status Partially Met              SLP Long Term Goals - 08/12/20 1030       SLP LONG TERM GOAL #1   Title Pt will use mulitmodal communication (gesture, draw, write 1st letter etc) to augment  verbal expression to meet needs at home with rare min A from family    Time 5    Period Weeks   or 17 total visits for all LTGs   Status On-going      SLP LONG TERM GOAL #2   Title Caregivers will appropriately cue patient and augment verbal expression when needed using recommended strategies given rare min A over 2 sessions    Time 5    Period Weeks    Status On-going      SLP LONG TERM GOAL #3   Title Pt will report reduced frustration and improved communication effectiveness via PROM by 3 points by last ST session    Baseline Comm SF= 16 & CES= 19    Time 5    Period Weeks    Status On-going              Plan - 08/12/20 1100     Clinical Impression Statement [name] continues to present with 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 SLP continues to reinforce rationale for communication aid due to nature of PPA. With occasional questioning cues and occasional semantic/phonemic cues, pt able to provided additional details re: medical professionals, service providers, and pertinent medical hx. Good independent use of description strategy and circumlocation noted in conversation with occasional cues required. Pt benefited from usual additional processing time for word finding. See "skilled treatment" for additional details of today's session. Continue skilled ST to maximize communication, including multimodal communication and educate pt and family as PPA progresses    Speech Therapy Frequency 2x / week    Duration 8 weeks    Treatment/Interventions Compensatory strategies;Patient/family education;Functional tasks;Cueing hierarchy;Cognitive reorganization;Multimodal communcation approach;SLP instruction and feedback;Internal/external aids;Compensatory techniques;Language facilitation    Potential to Achieve Goals Fair    Potential Considerations Medical prognosis    SLP Home Exercise Plan provided    Consulted and Agree with Plan of Care Patient              Patient will benefit from skilled therapeutic intervention in order to improve the following deficits and impairments:   Aphasia  Cognitive communication deficit    Problem List Patient Active Problem List   Diagnosis Date Noted   Changing skin lesion 02/25/2020   Paroxysmal atrial fibrillation ([name]) 12/31/2019   Elevated bilirubin 05/26/2019   Chronic anticoagulation    Tachycardia    Atrial fibrillation/flutter 05/24/2019   Atrial flutter ([facility]) 10/05/2017    Alinda Deem, MA CCC-SLP 08/12/2020, 11:13 AM  [facility] [address] [place], [place], [address] Phone: [phone]   Fax:  [phone]   Name: JOCSAN MCGINLEY MRN: [id number] Date of Birth: 05-03-1950

## 2020-08-17 ENCOUNTER — Ambulatory Visit: Payer: Medicare Other | Admitting: Speech Pathology

## 2020-08-17 ENCOUNTER — Encounter: Payer: Self-pay | Admitting: Speech Pathology

## 2020-08-17 ENCOUNTER — Other Ambulatory Visit: Payer: Self-pay

## 2020-08-17 DIAGNOSIS — R4701 Aphasia: Secondary | ICD-10-CM

## 2020-08-17 DIAGNOSIS — R41841 Cognitive communication deficit: Secondary | ICD-10-CM

## 2020-08-17 NOTE — Patient Instructions (Signed)
  Talk Path app by Adella Hare is free  Go to See all exercises and pick something to work on - just play around  When you work on Tell me more  which is good for you, do it with [name] for clues and feedback  and with properites  Just play around - no pressure, it is just for practice.

## 2020-08-17 NOTE — Therapy (Signed)
[facility] [address] [place], [place], [address] Phone: [phone]   Fax:  [phone]  Speech Language Pathology Treatment  Patient Details  Name: Bradley Mcbride MRN: [id number] Date of Birth: 1950-08-10 Referring Provider (SLP): Sanfilippo, Saintclair Halsted, PA-C   Encounter Date: 08/17/2020   End of Session - 08/17/20 1311     Visit Number 10    Number of Visits 17    Date for SLP Re-Evaluation 10/11/20    Authorization Type Medicare    SLP Start Time 1016    SLP Stop Time  1058    SLP Time Calculation (min) 42 min    Activity Tolerance Patient tolerated treatment well             Past Medical History:  Diagnosis Date   Atrial fibrillation ([place])    Chronic anticoagulation    Hyperlipidemia    Tachycardia     Past Surgical History:  Procedure Laterality Date   CARDIOVERSION N/A 10/06/2017   Procedure: CARDIOVERSION;  Surgeon: Fay Records, MD;  Location: [facility];  Service: Cardiovascular;  Laterality: N/A;   ORBITAL FRACTURE SURGERY     TEE WITHOUT CARDIOVERSION N/A 10/06/2017   Procedure: TRANSESOPHAGEAL ECHOCARDIOGRAM (TEE);  Surgeon: Fay Records, MD;  Location: [facility];  Service: Cardiovascular;  Laterality: N/A;   UMBILICAL HERNIA REPAIR      There were no vitals filed for this visit.   Subjective Assessment - 08/17/20 1031     Subjective "My brother is Bradley Mcbride who died"    Currently in Pain? No/denies                   ADULT SLP TREATMENT - 08/17/20 1032       General Information   Behavior/Cognition Alert;Cooperative;Pleasant mood      Treatment Provided   Treatment provided Cognitive-Linquistic      Cognitive-Linquistic Treatment   Treatment focused on Aphasia;Patient/family/caregiver education    Skilled Treatment Bradley Mcbride added his late brother and sister in laws names to communication supports. Introduced Bradley Mcbride to Bradley Mcbride for home practice. He demontrated  accurate completion of sentence scramble trials, writing trials. He required usual mod A with verb naming and "tell me more" (SFA) activities. I encouraged him to do them with his wife for feedback and cueing. Continue to encourage her to attend sessions      Assessment / Recommendations / [place] with current plan of care      Progression Toward Goals   Progression toward goals Progressing toward goals              SLP Education - 08/17/20 1308     Education Details talk path Mcbride, plan for carryover of language activities upon d/c    Person(s) Educated Patient    Methods Explanation;Demonstration;Verbal cues;Handout    Comprehension Verbalized understanding;Returned demonstration;Need further instruction              SLP Short Term Goals - 08/17/20 1311       SLP SHORT TERM GOAL #1   Title Pt will verbally generate 5-8 items in personally relevant categories with occasional min A over 2 sessions    Baseline 07-24-20 (weekly shops), 08-07-20    Period --   or 9 visits for all STGs   Status Achieved      SLP SHORT TERM GOAL #2   Title Pt will use mulitmodal communication (gesture, draw, write 1st letter etc) to augment  verbal expression with occasional min A over 2 sessions    Baseline 07-27-20; 08/10/20    Status Achieved      SLP SHORT TERM GOAL #3   Title Pt will discuss topics of interest in 5-10 minute simple conversation with use of compensations given occasional min A over 2 sessions    Baseline 07-23-20, 08-06-20    Status Achieved      SLP SHORT TERM GOAL #4   Title Pt's caregiver will appropriately cue patient when word finding episodes occur with occasional min A over 2 sessions    Status Partially Met              SLP Long Term Goals - 08/17/20 1311       SLP LONG TERM GOAL #1   Title Pt will use mulitmodal communication (gesture, draw, write 1st letter etc) to augment verbal expression to meet needs at home with rare min A from family     Time 4    Period Weeks   or 17 total visits for all LTGs   Status On-going      SLP LONG TERM GOAL #2   Title Caregivers will appropriately cue patient and augment verbal expression when needed using recommended strategies given rare min A over 2 sessions    Time 4    Period Weeks    Status On-going      SLP LONG TERM GOAL #3   Title Pt will report reduced frustration and improved communication effectiveness via PROM by 3 points by last ST session    Baseline Comm SF= 16 & CES= 19    Time 4    Period Weeks    Status On-going             Speech Therapy Progress Note  Dates of Reporting Period: 07/13/20 to 08/17/20  Objective Reports of Subjective Statement: Pt using compensatory strategies for progressive aphasia with occasional min A. Generating communication support pages to provide external aids for communication  Objective Measurements: Communication pages generated for family, friends, places, and activities.   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 He requires direct language therapy to improve verbal communication as well as ongoing generation of external communication supports. Communication support pages will be completed in the next 2-3 sessions, then training to use supports in conversation will be targeted. Today initiated training in language enhancing activities he can carryover upon completion of ST. He will need several more sessions to complete this skill as well.     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 SLP continues to reinforce rationale for communication support due to nature of PPA. . Good independent use of description strategy and circumlocation noted in conversation with occasional cues required. Pt benefited from usual additional processing time for word  finding. Introduced him to Talk Path Mcbride for ongoing home practice and he was able to navigate the Mcbride with occasional min A. Not sure if is Ipad will be compatible since it is old. Encouraged him to get updated Ipad and reinforced need for up to date technology to help support aphasia. See "skilled treatment" for additional details of today's session. Continue skilled ST to maximize communication, including multimodal communication and educate pt and family as PPA progresses    Speech Therapy Frequency 2x / week      Duration 8 weeks    Treatment/Interventions Compensatory strategies;Patient/family education;Functional tasks;Cueing hierarchy;Cognitive reorganization;Multimodal communcation approach;SLP instruction and feedback;Internal/external aids;Compensatory techniques;Language facilitation    Potential to Achieve Goals Fair    Potential Considerations Family/community support;Medical prognosis             Patient will benefit from skilled therapeutic intervention in order to improve the following deficits and impairments:   Aphasia  Cognitive communication deficit    Problem List Patient Active Problem List   Diagnosis Date Noted   Changing skin lesion 02/25/2020   Paroxysmal atrial fibrillation ([facility]) 12/31/2019   Elevated bilirubin 05/26/2019   Chronic anticoagulation    Tachycardia    Atrial fibrillation/flutter 05/24/2019   Atrial flutter ([facility]) 10/05/2017    ,  Ann MS, CCC-SLP 08/17/2020, 1:13 PM  [facility] [address] [place], , [address] Phone: [phone]   Fax:  [phone]   Name: Lamere C Mcgirr MRN: [id number] Date of Birth: 03/14/1950  

## 2020-08-19 ENCOUNTER — Ambulatory Visit: Payer: Medicare Other

## 2020-08-19 ENCOUNTER — Other Ambulatory Visit: Payer: Self-pay

## 2020-08-19 DIAGNOSIS — R41841 Cognitive communication deficit: Secondary | ICD-10-CM

## 2020-08-19 DIAGNOSIS — R4701 Aphasia: Secondary | ICD-10-CM

## 2020-08-19 NOTE — Therapy (Signed)
[facility] [address] [place], [place], [address] Phone: [phone]   Fax:  [phone]  Speech Language Pathology Treatment  Patient Details  Name: Bradley Mcbride MRN: [id number] Date of Birth: 10/24/1950 Referring Provider (SLP): Sanfilippo, Saintclair Halsted, PA-C   Encounter Date: 08/19/2020   End of Session - 08/19/20 1107     Visit Number 11    Number of Visits 17    Date for SLP Re-Evaluation 10/11/20    Authorization Type Medicare    SLP Start Time 1017    SLP Stop Time  1103    SLP Time Calculation (min) 46 min    Activity Tolerance Patient tolerated treatment well             Past Medical History:  Diagnosis Date   Atrial fibrillation ([place])    Chronic anticoagulation    Hyperlipidemia    Tachycardia     Past Surgical History:  Procedure Laterality Date   CARDIOVERSION N/A 10/06/2017   Procedure: CARDIOVERSION;  Surgeon: Fay Records, MD;  Location: [facility];  Service: Cardiovascular;  Laterality: N/A;   ORBITAL FRACTURE SURGERY     TEE WITHOUT CARDIOVERSION N/A 10/06/2017   Procedure: TRANSESOPHAGEAL ECHOCARDIOGRAM (TEE);  Surgeon: Fay Records, MD;  Location: [facility];  Service: Cardiovascular;  Laterality: N/A;   UMBILICAL HERNIA REPAIR      There were no vitals filed for this visit.   Subjective Assessment - 08/19/20 1104     Subjective "I was almost late. But I made it"    Currently in Pain? No/denies                   ADULT SLP TREATMENT - 08/19/20 1019       General Information   Behavior/Cognition Alert;Cooperative;Pleasant mood      Treatment Provided   Treatment provided Cognitive-Linquistic      Cognitive-Linquistic Treatment   Treatment focused on Aphasia;Patient/family/caregiver education    Skilled Treatment With additional time, pt able to recall and name part of HEP, Talk Path. Difficulty downloading app reported, in which pt to try on a  different device. SLP printed current version of communication support, in which pt able to provide additional details to family and friends given visual aid. SLP continues to modify communication support to include additional details and optimize comprehension/recall of pertinent information. Pt did not complete naming HEP as recommended, in which pt plans to complete before next session. Of note, pt did recall DOW of next upcoming appointments without need for cues.      Assessment / Recommendations / Plan   Plan Continue with current plan of care      Progression Toward Goals   Progression toward goals Progressing toward goals              SLP Education - 08/19/20 1106     Education Details communication aid, troubleshooting Talk Path    Person(s) Educated Patient    Methods Explanation;Demonstration;Handout    Comprehension Verbalized understanding;Returned demonstration;Need further instruction              SLP Short Term Goals - 08/17/20 1311       SLP SHORT TERM GOAL #1   Title Pt will verbally generate 5-8 items in personally relevant categories with occasional min A over 2 sessions    Baseline 07-24-20 (weekly shops), 08-07-20    Period --   or 9 visits for all STGs   Status Achieved  SLP SHORT TERM GOAL #2   Title Pt will use mulitmodal communication (gesture, draw, write 1st letter etc) to augment verbal expression with occasional min A over 2 sessions    Baseline 07-27-20; 08/10/20    Status Achieved      SLP SHORT TERM GOAL #3   Title Pt will discuss topics of interest in 5-10 minute simple conversation with use of compensations given occasional min A over 2 sessions    Baseline 07-23-20, 08-06-20    Status Achieved      SLP SHORT TERM GOAL #4   Title Pt's caregiver will appropriately cue patient when word finding episodes occur with occasional min A over 2 sessions    Status Partially Met              SLP Long Term Goals - 08/19/20 1107       SLP  LONG TERM GOAL #1   Title Pt will use mulitmodal communication (gesture, draw, write 1st letter etc) to augment verbal expression to meet needs at home with rare min A from family    Time 4    Period Weeks   or 17 total visits for all LTGs   Status On-going      SLP LONG TERM GOAL #2   Title Caregivers will appropriately cue patient and augment verbal expression when needed using recommended strategies given rare min A over 2 sessions    Time 4    Period Weeks    Status On-going      SLP LONG TERM GOAL #3   Title Pt will report reduced frustration and improved communication effectiveness via PROM by 3 points by last ST session    Baseline Comm SF= 16 & CES= 19    Time 4    Period Weeks    Status On-going              Plan - 08/19/20 1108     Clinical Impression Statement Bradley Mcbride continues to present with 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 SLP continues to reinforce rationale for communication support due to nature of PPA. Good independent use of description strategy and circumlocation noted in conversation with occasional cues required. Pt benefited from usual additional processing time for word finding. Pt unable to download TalkPath at this time, with additional resources to be provided for functional practice s/p ST discharge. See "skilled treatment" for additional details of today's session. Continue skilled ST to maximize communication, including multimodal communication and educate pt and family as PPA progresses    Speech Therapy Frequency 2x / week    Duration 8 weeks    Treatment/Interventions Compensatory strategies;Patient/family education;Functional tasks;Cueing hierarchy;Cognitive reorganization;Multimodal communcation approach;SLP instruction and feedback;Internal/external aids;Compensatory techniques;Language facilitation    Potential to Achieve Goals Fair    Potential Considerations Family/community support;Medical prognosis    SLP Home  Exercise Plan provided    Consulted and Agree with Plan of Care Patient             Patient will benefit from skilled therapeutic intervention in order to improve the following deficits and impairments:   Aphasia  Cognitive communication deficit    Problem List Patient Active Problem List   Diagnosis Date Noted   Changing skin lesion 02/25/2020   Paroxysmal atrial fibrillation ([place]) 12/31/2019   Elevated bilirubin 05/26/2019   Chronic anticoagulation    Tachycardia    Atrial fibrillation/flutter 05/24/2019   Atrial flutter ([place]) 10/05/2017    Alinda Deem, MA CCC-SLP  08/19/2020, 11:09 AM  [facility] [address] [place], [place], [address] Phone: [phone]   Fax:  [phone]   Name: Bradley Mcbride MRN: [id number] Date of Birth: [birth year]

## 2020-08-25 ENCOUNTER — Ambulatory Visit: Payer: Medicare Other | Attending: Physician Assistant

## 2020-08-25 ENCOUNTER — Other Ambulatory Visit: Payer: Self-pay

## 2020-08-25 DIAGNOSIS — R41841 Cognitive communication deficit: Secondary | ICD-10-CM | POA: Diagnosis present

## 2020-08-25 DIAGNOSIS — R4701 Aphasia: Secondary | ICD-10-CM | POA: Insufficient documentation

## 2020-08-25 NOTE — Therapy (Signed)
[facility] [address] [place], [place], [address] Phone: [phone]   Fax:  [phone]  Speech Language Pathology Treatment  Patient Details  Name: Bradley Mcbride MRN: [id number] Date of Birth: 04-Aug-1950 Referring Provider (SLP): Sanfilippo, Saintclair Halsted, PA-C   Encounter Date: 08/25/2020   End of Session - 08/25/20 1059     Visit Number 12    Number of Visits 17    Date for SLP Re-Evaluation 10/11/20    Authorization Type Medicare    SLP Start Time 1100    SLP Stop Time  1145    SLP Time Calculation (min) 45 min    Activity Tolerance Patient tolerated treatment well             Past Medical History:  Diagnosis Date   Atrial fibrillation ([place])    Chronic anticoagulation    Hyperlipidemia    Tachycardia     Past Surgical History:  Procedure Laterality Date   CARDIOVERSION N/A 10/06/2017   Procedure: CARDIOVERSION;  Surgeon: Bradley Records, MD;  Location: [facility];  Service: Cardiovascular;  Laterality: N/A;   ORBITAL FRACTURE SURGERY     TEE WITHOUT CARDIOVERSION N/A 10/06/2017   Procedure: TRANSESOPHAGEAL ECHOCARDIOGRAM (TEE);  Surgeon: Bradley Records, MD;  Location: [facility];  Service: Cardiovascular;  Laterality: N/A;   UMBILICAL HERNIA REPAIR      There were no vitals filed for this visit.   Subjective Assessment - 08/25/20 1100     Subjective "we grilled out this weekend"    Currently in Pain? No/denies                   ADULT SLP TREATMENT - 08/25/20 1059       General Information   Behavior/Cognition Alert;Cooperative;Pleasant mood      Treatment Provided   Treatment provided Cognitive-Linquistic      Cognitive-Linquistic Treatment   Treatment focused on Aphasia;Patient/family/caregiver education    Skilled Treatment Pt completed half of HWK targeting naming associations for targeted words, with appropriate associations named. Continued training and modification  of communication support book. Occasional halting exhibited in conversation, with additional processing time and descriptions used to aid word finding. Pt benefited from intermittent phonemic and first letter cues to name particular locations and doctors.      Assessment / Recommendations / Plan   Plan Continue with current plan of care      Progression Toward Goals   Progression toward goals Progressing toward goals              SLP Education - 08/25/20 1145     Education Details communication support    Person(s) Educated Patient    Methods Explanation;Demonstration;Handout    Comprehension Verbalized understanding;Returned demonstration;Need further instruction              SLP Short Term Goals - 08/17/20 1311       SLP SHORT TERM GOAL #1   Title Pt will verbally generate 5-8 items in personally relevant categories with occasional min A over 2 sessions    Baseline 07-24-20 (weekly shops), 08-07-20    Period --   or 9 visits for all STGs   Status Achieved      SLP SHORT TERM GOAL #2   Title Pt will use mulitmodal communication (gesture, draw, write 1st letter etc) to augment verbal expression with occasional min A over 2 sessions    Baseline 07-27-20; 08/10/20    Status Achieved  SLP SHORT TERM GOAL #3   Title Pt will discuss topics of interest in 5-10 minute simple conversation with use of compensations given occasional min A over 2 sessions    Baseline 07-23-20, 08-06-20    Status Achieved      SLP SHORT TERM GOAL #4   Title Pt's caregiver will appropriately cue patient when word finding episodes occur with occasional min A over 2 sessions    Status Partially Met              SLP Long Term Goals - 08/25/20 1059       SLP LONG TERM GOAL #1   Title Pt will use mulitmodal communication (gesture, draw, write 1st letter etc) to augment verbal expression to meet needs at home with rare min A from family    Time 3    Period Weeks   or 17 total visits for all LTGs    Status On-going      SLP LONG TERM GOAL #2   Title Caregivers will appropriately cue patient and augment verbal expression when needed using recommended strategies given rare min A over 2 sessions    Time 3    Period Weeks    Status On-going      SLP LONG TERM GOAL #3   Title Pt will report reduced frustration and improved communication effectiveness via PROM by 3 points by last ST session    Baseline Comm SF= 16 & CES= 19    Time 3    Period Weeks    Status On-going              Plan - 08/25/20 1100     Clinical Impression Statement [name] continues to present with 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 SLP continues to reinforce rationale and modification of communication support due to nature of PPA. Good independent use of description strategy and circumlocation noted in conversation with occasional cues required. Pt benefited from intermittent phonemic and first letter cues as well as usual additional processing time for word finding. See "skilled treatment" for additional details of today's session. Continue skilled ST to maximize communication, including multimodal communication and educate pt and family as PPA progresses    Speech Therapy Frequency 2x / week    Duration 8 weeks    Treatment/Interventions Compensatory strategies;Patient/family education;Functional tasks;Cueing hierarchy;Cognitive reorganization;Multimodal communcation approach;SLP instruction and feedback;Internal/external aids;Compensatory techniques;Language facilitation    Potential to Achieve Goals Fair    Potential Considerations Family/community support;Medical prognosis    SLP Home Exercise Plan provided    Consulted and Agree with Plan of Care Patient             Patient will benefit from skilled therapeutic intervention in order to improve the following deficits and impairments:   Aphasia  Cognitive communication deficit    Problem List Patient Active  Problem List   Diagnosis Date Noted   Changing skin lesion 02/25/2020   Paroxysmal atrial fibrillation ([place]) 12/31/2019   Elevated bilirubin 05/26/2019   Chronic anticoagulation    Tachycardia    Atrial fibrillation/flutter 05/24/2019   Atrial flutter ([place]) 10/05/2017    Bradley Deem, MA CCC-SLP 08/25/2020, 2:14 PM  [facility] [address] [place], [place], [address] Phone: [phone]   Fax:  [phone]   Name: JAROD BOZZO MRN: [id number] Date of Birth: 1950/11/30

## 2020-08-25 NOTE — Patient Instructions (Addendum)
  Atkins Jana Half 3  4 5  Cathie Olden and Exline names of their children Shanon Brow & ?) Number and names of their grandchildren  Dorian Pod and Frederico Hamman - number of children?

## 2020-08-28 ENCOUNTER — Ambulatory Visit: Payer: Medicare Other

## 2020-08-28 ENCOUNTER — Other Ambulatory Visit: Payer: Self-pay

## 2020-08-28 DIAGNOSIS — R41841 Cognitive communication deficit: Secondary | ICD-10-CM

## 2020-08-28 DIAGNOSIS — R4701 Aphasia: Secondary | ICD-10-CM | POA: Diagnosis not present

## 2020-08-28 NOTE — Therapy (Signed)
[facility] [address] [place], [place], [address] Phone: [phone]   Fax:  [phone]  Speech Language Pathology Treatment  Patient Details  Name: Bradley Mcbride MRN: [id number] Date of Birth: May 16, 1950 Referring Provider (SLP): Sanfilippo, Saintclair Halsted, PA-C   Encounter Date: 08/28/2020   End of Session - 08/28/20 1045     Visit Number 13    Number of Visits 17    Date for SLP Re-Evaluation 10/11/20    Authorization Type Medicare    SLP Start Time 1016    SLP Stop Time  1100    SLP Time Calculation (min) 44 min    Activity Tolerance Patient tolerated treatment well             Past Medical History:  Diagnosis Date   Atrial fibrillation ([place])    Chronic anticoagulation    Hyperlipidemia    Tachycardia     Past Surgical History:  Procedure Laterality Date   CARDIOVERSION N/A 10/06/2017   Procedure: CARDIOVERSION;  Surgeon: Fay Records, MD;  Location: [facility];  Service: Cardiovascular;  Laterality: N/A;   ORBITAL FRACTURE SURGERY     TEE WITHOUT CARDIOVERSION N/A 10/06/2017   Procedure: TRANSESOPHAGEAL ECHOCARDIOGRAM (TEE);  Surgeon: Fay Records, MD;  Location: [facility];  Service: Cardiovascular;  Laterality: N/A;   UMBILICAL HERNIA REPAIR      There were no vitals filed for this visit.   Subjective Assessment - 08/28/20 1018     Subjective "it's been a week"    Currently in Pain? No/denies                   ADULT SLP TREATMENT - 08/28/20 1027       General Information   Behavior/Cognition Alert;Cooperative;Pleasant mood      Treatment Provided   Treatment provided Cognitive-Linquistic      Cognitive-Linquistic Treatment   Treatment focused on Aphasia;Patient/family/caregiver education    Skilled Treatment Pt requested assistance to ID associations for targeted words. SLP educated patient on using pictures and WH-questions to self-cue. Pt able to demonstrate use  of strategies with occasional min to mod A and usual additional processing time. Usual verbal cues required for spelling words. SLP printed communication book this session, in which pt reviewed and recalled need to include [facility] provider. Pt recalled minimal details of [facility] neurology appointments, in which SLP provided education based on MD notes. PPA added to medical hx section.      Assessment / Recommendations / Plan   Plan Continue with current plan of care      Progression Toward Goals   Progression toward goals Progressing toward goals              SLP Education - 08/28/20 1035     Education Details cueing strategies, communication support    Person(s) Educated Patient    Methods Explanation;Demonstration;Handout    Comprehension Verbalized understanding;Returned demonstration;Need further instruction              SLP Short Term Goals - 08/17/20 1311       SLP SHORT TERM GOAL #1   Title Pt will verbally generate 5-8 items in personally relevant categories with occasional min A over 2 sessions    Baseline 07-24-20 (weekly shops), 08-07-20    Period --   or 9 visits for all STGs   Status Achieved      SLP SHORT TERM GOAL #2   Title Pt will use mulitmodal  communication (gesture, draw, write 1st letter etc) to augment verbal expression with occasional min A over 2 sessions    Baseline 07-27-20; 08/10/20    Status Achieved      SLP SHORT TERM GOAL #3   Title Pt will discuss topics of interest in 5-10 minute simple conversation with use of compensations given occasional min A over 2 sessions    Baseline 07-23-20, 08-06-20    Status Achieved      SLP SHORT TERM GOAL #4   Title Pt's caregiver will appropriately cue patient when word finding episodes occur with occasional min A over 2 sessions    Status Partially Met              SLP Long Term Goals - 08/28/20 1046       SLP LONG TERM GOAL #1   Title Pt will use mulitmodal communication (gesture, draw, write 1st  letter etc) to augment verbal expression to meet needs at home with rare min A from family    Time 3    Period Weeks   or 17 total visits for all LTGs   Status On-going      SLP LONG TERM GOAL #2   Title Caregivers will appropriately cue patient and augment verbal expression when needed using recommended strategies given rare min A over 2 sessions    Time 3    Period Weeks    Status On-going      SLP LONG TERM GOAL #3   Title Pt will report reduced frustration and improved communication effectiveness via PROM by 3 points by last ST session    Baseline Comm SF= 16 & CES= 19    Time 3    Period Weeks    Status On-going              Plan - 08/28/20 1047     Clinical Impression Statement Bradley Mcbride continues to present with 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 SLP provided ongoing training and instruction of anomia strategies, including ways to self-cue using pictures and [facility] questions. SLP continues to reinforce rationale and modification of communication support due to nature of PPA. Pt benefited from intermittent phonemic, semantic, and first letter cues as well as usual additional processing time for word finding. See "skilled treatment" for additional details of today's session. Continue skilled ST to maximize communication, including multimodal communication and educate pt and family as PPA progresses    Speech Therapy Frequency 2x / week    Duration 8 weeks    Treatment/Interventions Compensatory strategies;Patient/family education;Functional tasks;Cueing hierarchy;Cognitive reorganization;Multimodal communcation approach;SLP instruction and feedback;Internal/external aids;Compensatory techniques;Language facilitation    Potential to Achieve Goals Fair    Potential Considerations Family/community support;Medical prognosis    SLP Home Exercise Plan provided    Consulted and Agree with Plan of Care Patient             Patient will benefit from  skilled therapeutic intervention in order to improve the following deficits and impairments:   Aphasia  Cognitive communication deficit    Problem List Patient Active Problem List   Diagnosis Date Noted   Changing skin lesion 02/25/2020   Paroxysmal atrial fibrillation ([facility]) 12/31/2019   Elevated bilirubin 05/26/2019   Chronic anticoagulation    Tachycardia    Atrial fibrillation/flutter 05/24/2019   Atrial flutter ([facility]) 10/05/2017    Alinda Deem, MA CCC-SLP 08/28/2020, 12:04 PM  [facility] [address] [place], [place],  [address] Phone: [phone]   Fax:  [phone]   Name: Bradley Mcbride MRN: [id number] Date of Birth: 01-Aug-1950

## 2020-08-28 NOTE — Patient Instructions (Signed)
  Cue yourself using pictures and/or WH-questions (who, what, when, where, why)  Ex:  Who uses it?  What is it?  Where do you find?  When would you use it?  Why would you use it?

## 2020-08-31 ENCOUNTER — Other Ambulatory Visit: Payer: Self-pay

## 2020-08-31 ENCOUNTER — Ambulatory Visit: Payer: Medicare Other

## 2020-08-31 DIAGNOSIS — R4701 Aphasia: Secondary | ICD-10-CM | POA: Diagnosis not present

## 2020-08-31 DIAGNOSIS — R41841 Cognitive communication deficit: Secondary | ICD-10-CM

## 2020-08-31 NOTE — Therapy (Signed)
[facility] [address] [place], [place], [address] Phone: [phone]   Fax:  [phone]  Speech Language Pathology Treatment  Patient Details  Name: Bradley Mcbride MRN: [id number] Date of Birth: 11-Oct-1950 Referring Provider (SLP): Sanfilippo, Saintclair Halsted, PA-C   Encounter Date: 08/31/2020   End of Session - 08/31/20 1020     Visit Number 14    Number of Visits 17    Date for SLP Re-Evaluation 10/11/20    Authorization Type Medicare    SLP Start Time 1016    SLP Stop Time  1100    SLP Time Calculation (min) 44 min    Activity Tolerance Patient tolerated treatment well             Past Medical History:  Diagnosis Date   Atrial fibrillation ([facility])    Chronic anticoagulation    Hyperlipidemia    Tachycardia     Past Surgical History:  Procedure Laterality Date   CARDIOVERSION N/A 10/06/2017   Procedure: CARDIOVERSION;  Surgeon: Fay Records, MD;  Location: [facility];  Service: Cardiovascular;  Laterality: N/A;   ORBITAL FRACTURE SURGERY     TEE WITHOUT CARDIOVERSION N/A 10/06/2017   Procedure: TRANSESOPHAGEAL ECHOCARDIOGRAM (TEE);  Surgeon: Fay Records, MD;  Location: [facility];  Service: Cardiovascular;  Laterality: N/A;   UMBILICAL HERNIA REPAIR      There were no vitals filed for this visit.          ADULT SLP TREATMENT - 08/31/20 1019       General Information   Behavior/Cognition Alert;Cooperative;Pleasant mood      Treatment Provided   Treatment provided Cognitive-Linquistic      Cognitive-Linquistic Treatment   Treatment focused on Aphasia;Patient/family/caregiver education    Skilled Treatment SLP reviewed HEP for naming in specific categories given written cues. Pt required occasional min A via semantic cues, first letter cues, and visual cues. Pt reported his wife provided semantic cue x1 during HEP, in which SLP explained to patient the cuing hierarchy for wife to  use as pt's wife was "reluctant to help." Pt reportedly used recently learned techniques [facility]- questions) to cue self. SLP targeted generating synonyms for targeted words, in which pt able to ID appropriate synonyms with occasional min A and intermittent additional processing time. SLP recommended patient review communication aid with his wife to ID any additional information missed or needed.      Assessment / Recommendations / Plan   Plan Continue with current plan of care      Progression Toward Goals   Progression toward goals Progressing toward goals              SLP Education - 08/31/20 1315     Education Details cueing hierarchy, communication support    Person(s) Educated Patient    Methods Explanation;Demonstration;Handout    Comprehension Verbalized understanding;Returned demonstration;Need further instruction              SLP Short Term Goals - 08/17/20 1311       SLP SHORT TERM GOAL #1   Title Pt will verbally generate 5-8 items in personally relevant categories with occasional min A over 2 sessions    Baseline 07-24-20 (weekly shops), 08-07-20    Period --   or 9 visits for all STGs   Status Achieved      SLP SHORT TERM GOAL #2   Title Pt will use mulitmodal communication (gesture, draw, write 1st letter etc) to augment verbal  expression with occasional min A over 2 sessions    Baseline 07-27-20; 08/10/20    Status Achieved      SLP SHORT TERM GOAL #3   Title Pt will discuss topics of interest in 5-10 minute simple conversation with use of compensations given occasional min A over 2 sessions    Baseline 07-23-20, 08-06-20    Status Achieved      SLP SHORT TERM GOAL #4   Title Pt's caregiver will appropriately cue patient when word finding episodes occur with occasional min A over 2 sessions    Status Partially Met              SLP Long Term Goals - 08/31/20 1020       SLP LONG TERM GOAL #1   Title Pt will use mulitmodal communication (gesture, draw,  write 1st letter etc) to augment verbal expression to meet needs at home with rare min A from family    Time 2    Period Weeks   or 17 total visits for all LTGs   Status On-going      SLP LONG TERM GOAL #2   Title Caregivers will appropriately cue patient and augment verbal expression when needed using recommended strategies given rare min A over 2 sessions    Baseline 08-31-20    Time 2    Period Weeks    Status On-going      SLP LONG TERM GOAL #3   Title Pt will report reduced frustration and improved communication effectiveness via PROM by 3 points by last ST session    Baseline Comm SF= 16 & CES= 19    Time 2    Period Weeks    Status On-going              Plan - 08/31/20 1020     Clinical Impression Statement Bradley Mcbride continues to present with 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 SLP provided ongoing training and instruction of anomia strategies, including ways to self-cue using pictures and cueing hierarchy for wife to use. SLP continues to reinforce rationale and modification of communication support due to nature of PPA. Pt benefited from intermittent phonemic, semantic, and first letter cues as well as usual additional processing time for word finding on structured tasks this session. See "skilled treatment" for additional details of today's session. Continue skilled ST to maximize communication, including multimodal communication and educate pt and family as PPA progresses    Speech Therapy Frequency 2x / week    Duration 8 weeks    Treatment/Interventions Compensatory strategies;Patient/family education;Functional tasks;Cueing hierarchy;Cognitive reorganization;Multimodal communcation approach;SLP instruction and feedback;Internal/external aids;Compensatory techniques;Language facilitation    Potential to Achieve Goals Fair    Potential Considerations Family/community support;Medical prognosis    SLP Home Exercise Plan provided    Consulted and  Agree with Plan of Care Patient             Patient will benefit from skilled therapeutic intervention in order to improve the following deficits and impairments:   Aphasia  Cognitive communication deficit    Problem List Patient Active Problem List   Diagnosis Date Noted   Changing skin lesion 02/25/2020   Paroxysmal atrial fibrillation ([facility]) 12/31/2019   Elevated bilirubin 05/26/2019   Chronic anticoagulation    Tachycardia    Atrial fibrillation/flutter 05/24/2019   Atrial flutter ([facility]) 10/05/2017    Alinda Deem, MA CCC-SLP 08/31/2020, 1:16 PM  [facility] [address]  [place], [place], [address] Phone: [phone]   Fax:  [phone]   Name: Bradley Mcbride MRN: [id number] Date of Birth: 07-Mar-1950

## 2020-09-02 ENCOUNTER — Other Ambulatory Visit: Payer: Self-pay

## 2020-09-02 ENCOUNTER — Ambulatory Visit: Payer: Medicare Other

## 2020-09-02 DIAGNOSIS — R41841 Cognitive communication deficit: Secondary | ICD-10-CM

## 2020-09-02 DIAGNOSIS — R4701 Aphasia: Secondary | ICD-10-CM

## 2020-09-02 NOTE — Patient Instructions (Signed)
  Rebecca's Birthday? Leo's Birthday?  Mitch and Rebecca's wedding anniversary?

## 2020-09-02 NOTE — Therapy (Signed)
[facility] [address] [place], [place], [address] Phone: [phone]   Fax:  [phone]  Speech Language Pathology Treatment  Patient Details  Name: Bradley Mcbride MRN: [id number] Date of Birth: [birth year] Referring Provider (SLP): Sanfilippo, Saintclair Halsted, PA-C   Encounter Date: 09/02/2020   End of Session - 09/02/20 1106     Visit Number 15    Number of Visits 17    Date for SLP Re-Evaluation 10/11/20    Authorization Type Medicare    SLP Start Time 1101    SLP Stop Time  1145    SLP Time Calculation (min) 44 min    Activity Tolerance Patient tolerated treatment well             Past Medical History:  Diagnosis Date   Atrial fibrillation ([facility])    Chronic anticoagulation    Hyperlipidemia    Tachycardia     Past Surgical History:  Procedure Laterality Date   CARDIOVERSION N/A 10/06/2017   Procedure: CARDIOVERSION;  Surgeon: Fay Records, MD;  Location: [facility];  Service: Cardiovascular;  Laterality: N/A;   ORBITAL FRACTURE SURGERY     TEE WITHOUT CARDIOVERSION N/A 10/06/2017   Procedure: TRANSESOPHAGEAL ECHOCARDIOGRAM (TEE);  Surgeon: Fay Records, MD;  Location: [facility];  Service: Cardiovascular;  Laterality: N/A;   UMBILICAL HERNIA REPAIR      There were no vitals filed for this visit.   Subjective Assessment - 09/02/20 1155     Subjective "I was a little late because of the time change (11am appt versus 10:15am)    Currently in Pain? No/denies                   ADULT SLP TREATMENT - 09/02/20 1105       General Information   Behavior/Cognition Alert;Cooperative;Pleasant mood      Treatment Provided   Treatment provided Cognitive-Linquistic      Cognitive-Linquistic Treatment   Treatment focused on Aphasia;Patient/family/caregiver education    Skilled Treatment SLP reviewed HEP, in which pt was recommended to name 10-15 items in each category. Pt reported some  difficulty writing and naming in specific categories. Pt's wife wrote and provided cues as needed. SLP reviewed anomia strategies, including use of mental picture, visuals, and open-ended questions to aid word finding. Intermittent first letter and phonemic cues required this session. SLP targeted use of communication book to ID specific information, in which pt able to ID and verbalize with rare min A. While pt able to answer targeted questions with limited need for communication support at this time, SLP emphasized importance of communication support as PPA progresses.      Assessment / Recommendations / Plan   Plan Continue with current plan of care      Progression Toward Goals   Progression toward goals Progressing toward goals              SLP Education - 09/02/20 1200     Education Details HEP, anomia strategies, communication support    Person(s) Educated Patient    Methods Explanation;Demonstration;Handout    Comprehension Verbalized understanding;Returned demonstration;Need further instruction              SLP Short Term Goals - 08/17/20 1311       SLP SHORT TERM GOAL #1   Title Pt will verbally generate 5-8 items in personally relevant categories with occasional min A over 2 sessions    Baseline 07-24-20 (weekly shops), 08-07-20  Period --   or 9 visits for all STGs   Status Achieved      SLP SHORT TERM GOAL #2   Title Pt will use mulitmodal communication (gesture, draw, write 1st letter etc) to augment verbal expression with occasional min A over 2 sessions    Baseline 07-27-20; 08/10/20    Status Achieved      SLP SHORT TERM GOAL #3   Title Pt will discuss topics of interest in 5-10 minute simple conversation with use of compensations given occasional min A over 2 sessions    Baseline 07-23-20, 08-06-20    Status Achieved      SLP SHORT TERM GOAL #4   Title Pt's caregiver will appropriately cue patient when word finding episodes occur with occasional min A over 2  sessions    Status Partially Met              SLP Long Term Goals - 09/02/20 1107       SLP LONG TERM GOAL #1   Title Pt will use mulitmodal communication (gesture, draw, write 1st letter etc) to augment verbal expression to meet needs at home with rare min A from family    Time 2    Period Weeks   or 17 total visits for all LTGs   Status On-going      SLP LONG TERM GOAL #2   Title Caregivers will appropriately cue patient and augment verbal expression when needed using recommended strategies given rare min A over 2 sessions    Baseline 08-31-20    Time 2    Period Weeks    Status On-going      SLP LONG TERM GOAL #3   Title Pt will report reduced frustration and improved communication effectiveness via PROM by 3 points by last ST session    Baseline Comm SF= 16 & CES= 19    Time 2    Period Weeks    Status On-going              Plan - 09/02/20 1107     Clinical Impression Statement Bradley Mcbride continues to present with 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 SLP provided ongoing training and instruction of anomia strategies, including ways to self-cue using visuals and cueing hierarchy for wife to use. SLP targeted use of communication support to ID specific information, in which pt able to locate information with rare min A. Pt benefited from intermittent phonemic, semantic, and first letter cues as well as usual additional processing time for word finding on structured tasks this session. See "skilled treatment" for additional details of today's session. Continue skilled ST to maximize communication, including multimodal communication and educate pt and family as PPA progresses    Speech Therapy Frequency 2x / week    Duration 8 weeks    Treatment/Interventions Compensatory strategies;Patient/family education;Functional tasks;Cueing hierarchy;Cognitive reorganization;Multimodal communcation approach;SLP instruction and feedback;Internal/external  aids;Compensatory techniques;Language facilitation    Potential to Achieve Goals Fair    Potential Considerations Family/community support;Medical prognosis    SLP Home Exercise Plan provided    Consulted and Agree with Plan of Care Patient             Patient will benefit from skilled therapeutic intervention in order to improve the following deficits and impairments:   Aphasia  Cognitive communication deficit    Problem List Patient Active Problem List   Diagnosis Date Noted   Changing skin lesion 02/25/2020   Paroxysmal atrial fibrillation ([facility])  12/31/2019   Elevated bilirubin 05/26/2019   Chronic anticoagulation    Tachycardia    Atrial fibrillation/flutter 05/24/2019   Atrial flutter ([place]) 10/05/2017    Alinda Deem, MA CCC-SLP 09/02/2020, 12:08 PM  [facility] [address] [place], [place], [address] Phone: [phone]   Fax:  [phone]   Name: Bradley Mcbride MRN: [id number] Date of Birth: August 03, 1950

## 2020-09-07 ENCOUNTER — Other Ambulatory Visit: Payer: Self-pay

## 2020-09-07 ENCOUNTER — Encounter: Payer: Self-pay | Admitting: Speech Pathology

## 2020-09-07 ENCOUNTER — Ambulatory Visit: Payer: Medicare Other | Admitting: Speech Pathology

## 2020-09-07 DIAGNOSIS — R4701 Aphasia: Secondary | ICD-10-CM | POA: Diagnosis not present

## 2020-09-07 NOTE — Patient Instructions (Addendum)
  Talk Path app - get with Alroy Dust on this - you may need help getting used to the activities  Remember, you can increase/decrease the difficulty on the activities on Talk Path  With aphasia, it is good to have up to date technology - if your tablet is too old, ask for one for Christmas   If/when the aphasia progresses, please come back for another course of ST if communication is getting more frustrating for either you or your family  Next session we will finalize your communication support pages - please use them as needed - a cheat sheet is OK.

## 2020-09-07 NOTE — Therapy (Signed)
[facility] [address] [place], [place], [address] Phone: [phone]   Fax:  [phone]  Speech Language Pathology Treatment  Patient Details  Name: Bradley Mcbride MRN: [id number] Date of Birth: 03/26/1950 Referring Provider (SLP): Sanfilippo, Saintclair Halsted, PA-C   Encounter Date: 09/07/2020   End of Session - 09/07/20 1104     Visit Number 16    Number of Visits 17    Date for SLP Re-Evaluation 10/11/20    Authorization Type Medicare    SLP Start Time 1017    SLP Stop Time  1100    SLP Time Calculation (min) 43 min    Activity Tolerance Patient tolerated treatment well             Past Medical History:  Diagnosis Date   Atrial fibrillation ([place])    Chronic anticoagulation    Hyperlipidemia    Tachycardia     Past Surgical History:  Procedure Laterality Date   CARDIOVERSION N/A 10/06/2017   Procedure: CARDIOVERSION;  Surgeon: Fay Records, MD;  Location: [facility];  Service: Cardiovascular;  Laterality: N/A;   ORBITAL FRACTURE SURGERY     TEE WITHOUT CARDIOVERSION N/A 10/06/2017   Procedure: TRANSESOPHAGEAL ECHOCARDIOGRAM (TEE);  Surgeon: Fay Records, MD;  Location: [facility];  Service: Cardiovascular;  Laterality: N/A;   UMBILICAL HERNIA REPAIR      There were no vitals filed for this visit.   Subjective Assessment - 09/07/20 1024     Subjective "We are going to [place], Gilberto Better is turning 7"    Currently in Pain? No/denies                   ADULT SLP TREATMENT - 09/07/20 1025       General Information   Behavior/Cognition Alert;Cooperative;Pleasant mood      Treatment Provided   Treatment provided Cognitive-Linquistic      Cognitive-Linquistic Treatment   Treatment focused on Aphasia;Patient/family/caregiver education    Skilled Treatment Don broought in birthday's for communication support pages. Targeted word finding, sentnece generation and compensations  for aphasia generating sentences with multiple meaning sentneces - he utilized verbal descriptions and gestures to compensate for aphasia 3x with rare min A. In conversation, Bradley Mcbride required 2 cues to use his communication support pages for daughter in laws profression and grandson's name      Assessment / Recommendations / Plan   Plan Continue with current plan of care      Progression Toward Goals   Progression toward goals Progressing toward goals                SLP Short Term Goals - 09/07/20 1051       SLP SHORT TERM GOAL #1   Title Pt will verbally generate 5-8 items in personally relevant categories with occasional min A over 2 sessions    Baseline 07-24-20 (weekly shops), 08-07-20    Period --   or 9 visits for all STGs   Status Achieved      SLP SHORT TERM GOAL #2   Title Pt will use mulitmodal communication (gesture, draw, write 1st letter etc) to augment verbal expression with occasional min A over 2 sessions    Baseline 07-27-20; 08/10/20    Status Achieved      SLP SHORT TERM GOAL #3   Title Pt will discuss topics of interest in 5-10 minute simple conversation with use of compensations given occasional min A over 2 sessions  Baseline 07-23-20, 08-06-20    Status Achieved      SLP SHORT TERM GOAL #4   Title Pt's caregiver will appropriately cue patient when word finding episodes occur with occasional min A over 2 sessions    Status Partially Met              SLP Long Term Goals - 09/07/20 1052       [place] #1   Title Pt will use mulitmodal communication (gesture, draw, write 1st letter etc) to augment verbal expression to meet needs at home with rare min A from family    Baseline 09/07/20    Time 1    Period Weeks   or 17 total visits for all LTGs   Status On-going      SLP LONG TERM GOAL #2   Title Caregivers will appropriately cue patient and augment verbal expression when needed using recommended strategies given rare min A over 2 sessions     Baseline 08-31-20    Time 1    Period Weeks    Status On-going      SLP LONG TERM GOAL #3   Title Pt will report reduced frustration and improved communication effectiveness via PROM by 3 points by last ST session    Baseline Comm SF= 16 & CES= 19    Time 1    Period Weeks    Status On-going              Plan - 09/07/20 1049     Clinical Impression Statement Bradley Mcbride continues to present with 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 SLP provided ongoing training and instruction of anomia strategies, including ways to self-cue using visuals and cueing hierarchy for wife to use. SLP targeted use of communication support to ID specific information, in which pt able to locate information with rare min A. Pt benefited from intermittent phonemic, semantic, and first letter cues as well as usual additional processing time for word finding on structured tasks this session. See "skilled treatment" for additional details of today's session. Continue skilled ST 1 more visists to maximize communication, including multimodal communication and educate pt and family as PPA progresses    Speech Therapy Frequency 2x / week    Duration 8 weeks    Treatment/Interventions Compensatory strategies;Patient/family education;Functional tasks;Cueing hierarchy;Cognitive reorganization;Multimodal communcation approach;SLP instruction and feedback;Internal/external aids;Compensatory techniques;Language facilitation    Potential to [place]             Patient will benefit from skilled therapeutic intervention in order to improve the following deficits and impairments:   Aphasia    Problem List Patient Active Problem List   Diagnosis Date Noted   Changing skin lesion 02/25/2020   Paroxysmal atrial fibrillation ([place]) 12/31/2019   Elevated bilirubin 05/26/2019   Chronic anticoagulation    Tachycardia    Atrial fibrillation/flutter 05/24/2019   Atrial flutter ([place])  10/05/2017    Bradley Mcbride, [place], [place] 09/07/2020, 11:04 AM  [facility] [address] [place], [place], [address] Phone: [phone]   Fax:  [phone]   Name: Bradley Mcbride MRN: [id number] Date of Birth: Jun 28, 1950

## 2020-09-09 ENCOUNTER — Ambulatory Visit: Payer: Medicare Other | Admitting: Speech Pathology

## 2020-09-09 ENCOUNTER — Encounter: Payer: Self-pay | Admitting: Speech Pathology

## 2020-09-09 ENCOUNTER — Other Ambulatory Visit: Payer: Self-pay

## 2020-09-09 DIAGNOSIS — R4701 Aphasia: Secondary | ICD-10-CM | POA: Diagnosis not present

## 2020-09-09 DIAGNOSIS — R41841 Cognitive communication deficit: Secondary | ICD-10-CM

## 2020-09-09 NOTE — Patient Instructions (Addendum)
   Talk Path app will be a good way for you to practice speech/language skills as you are done with ST (for now)  You can also do the cognitive activities for memory and such  Have a great summer and fall!! You have done great work in [place]    [phone] office   Use the surveys you took today to help judge if/when you need to come back  Self advocate in the community and over the phone. "I have some trouble  talking, I will need extra time to get my words out." So the listener knows to be patient and help you.

## 2020-09-09 NOTE — Therapy (Signed)
[facility] [address] [place], [place], [address] Phone: [phone]   Fax:  [phone]  Speech Language Pathology Treatment  Patient Details  Name: Bradley Mcbride MRN: [id number] Date of Birth: [birth year] Referring Provider (SLP): Sanfilippo, Saintclair Halsted, PA-C   Encounter Date: 09/09/2020   End of Session - 09/09/20 1054     Visit Number 17    Number of Visits 17    Date for SLP Re-Evaluation 10/11/20    Authorization Type Medicare    SLP Start Time 1015    SLP Stop Time  1100    SLP Time Calculation (min) 45 min    Activity Tolerance Patient tolerated treatment well             Past Medical History:  Diagnosis Date   Atrial fibrillation ([place])    Chronic anticoagulation    Hyperlipidemia    Tachycardia     Past Surgical History:  Procedure Laterality Date   CARDIOVERSION N/A 10/06/2017   Procedure: CARDIOVERSION;  Surgeon: Fay Records, MD;  Location: [facility];  Service: Cardiovascular;  Laterality: N/A;   ORBITAL FRACTURE SURGERY     TEE WITHOUT CARDIOVERSION N/A 10/06/2017   Procedure: TRANSESOPHAGEAL ECHOCARDIOGRAM (TEE);  Surgeon: Fay Records, MD;  Location: [facility];  Service: Cardiovascular;  Laterality: N/A;   UMBILICAL HERNIA REPAIR      There were no vitals filed for this visit.   Subjective Assessment - 09/09/20 1017     Subjective "I did a good thing for me yesterday" - went to bed early    Currently in Pain? No/denies                   ADULT SLP TREATMENT - 09/09/20 1019       General Information   Behavior/Cognition Alert;Cooperative;Pleasant mood      Treatment Provided   Treatment provided Cognitive-Linquistic      Cognitive-Linquistic Treatment   Treatment focused on Aphasia;Patient/family/caregiver education    Skilled Treatment Bradley Mcbride has not gotten Talk Path app as of yet. They figured out his Ipad was 70 years old. They are working on buying a  new Ipad Provided final copy of his communication pages. He used commication support pages to find a name or word 2x as needed. Bradley Mcbride re-the Communication Short Form score of 15 and Communicative Effectiveness Survey with a score of 22 - I instructued him to keep the surveys as a measure of any decline in speech or language and to show them to Bradley Mcbride so she can also help ID any changes. Bradley Mcbride used verbal compensations for word finding episodes as needed with 1 rare min questioning cue.      Assessment / Recommendations / Plan   Plan Discharge SLP treatment due to (comment)      Progression Toward Goals   Progression toward goals Goals met, education completed, patient discharged from SLP              SLP Education - 09/09/20 1047     Education Details use the self rating surveys to assess for any changes in speech/language; get the Talk Path app; self advocate on phone and in the community    Person(s) Educated Patient    Methods Explanation;Demonstration;Verbal cues;Handout             SPEECH THERAPY DISCHARGE SUMMARY  Visits from Start of Care: 17  Current functional level related to goals / functional outcomes: See goals below  Remaining deficits: Primary Progressive Aphasia   Education / Equipment: Compensations for aphasia, augmentative alternative communication, PPA ed   Patient agrees to discharge. Patient goals were met. Patient is being discharged due to meeting the stated rehab goals.Marland Kitchen       SLP Short Term Goals - 09/09/20 1053       SLP SHORT TERM GOAL #1   Title Pt will verbally generate 5-8 items in personally relevant categories with occasional min A over 2 sessions    Baseline 07-24-20 (weekly shops), 08-07-20    Period --   or 9 visits for all STGs   Status Achieved      SLP SHORT TERM GOAL #2   Title Pt will use mulitmodal communication (gesture, draw, write 1st letter etc) to augment verbal expression with occasional min A over 2 sessions     Baseline 07-27-20; 08/10/20    Status Achieved      SLP SHORT TERM GOAL #3   Title Pt will discuss topics of interest in 5-10 minute simple conversation with use of compensations given occasional min A over 2 sessions    Baseline 07-23-20, 08-06-20    Status Achieved      SLP SHORT TERM GOAL #4   Title Pt's caregiver will appropriately cue patient when word finding episodes occur with occasional min A over 2 sessions    Status Partially Met              SLP Long Term Goals - 09/09/20 1053       SLP LONG TERM GOAL #1   Title Pt will use mulitmodal communication (gesture, draw, write 1st letter etc) to augment verbal expression to meet needs at home with rare min A from family    Baseline 09/07/20; 09/09/20    Time 1    Period Weeks   or 17 total visits for all LTGs   Status Achieved      SLP LONG TERM GOAL #2   Title Caregivers will appropriately cue patient and augment verbal expression when needed using recommended strategies given rare min A over 2 sessions    Baseline 08-31-20    Time 1    Period Weeks    Status Partially Met      SLP LONG TERM GOAL #3   Title Pt will report reduced frustration and improved communication effectiveness via PROM by 3 points by last ST session    Baseline Comm SF= 16 & CES= 19; CES final score 22    Time 1    Period Weeks    Status Achieved              Plan - 09/09/20 1049     Clinical Impression Statement Bradley Mcbride continues to present with mild aphasia and cognitive impairments due to primary progressive aphasia. Communication support pages and training in using them complete. Bradley Mcbride is using verbal and non verbal compensations for aphasia as needed. Provided self rating surveys for Bradley Mcbride and Bradley Mcbride to assess any future changes in speech/language. Bradley Mcbride acknowledges he may need to return to ST as PPA progresses and communication becomes more difficulty. If they return, spouse or family participation in [place] will be necessary. Goals met, education  complete. D/C ST at this time, pt in agreement    Speech Therapy Frequency 2x / week    Duration 8 weeks    Treatment/Interventions Compensatory strategies;Patient/family education;Functional tasks;Cueing hierarchy;Cognitive reorganization;Multimodal communcation approach;SLP instruction and feedback;Internal/external aids;Compensatory techniques;Language facilitation    Potential to [place]  Potential Considerations Family/community support;Medical prognosis             Patient will benefit from skilled therapeutic intervention in order to improve the following deficits and impairments:   Aphasia  Cognitive communication deficit    Problem List Patient Active Problem List   Diagnosis Date Noted   Changing skin lesion 02/25/2020   Paroxysmal atrial fibrillation ([place]) 12/31/2019   Elevated bilirubin 05/26/2019   Chronic anticoagulation    Tachycardia    Atrial fibrillation/flutter 05/24/2019   Atrial flutter ([place]) 10/05/2017    Bradley Mcbride, [place], [place] 09/09/2020, 10:55 AM  [facility] [address] [place], [place], [address] Phone: [phone]   Fax:  [phone]   Name: Bradley Mcbride MRN: [id number] Date of Birth: [birth year]

## 2020-09-10 ENCOUNTER — Ambulatory Visit (INDEPENDENT_AMBULATORY_CARE_PROVIDER_SITE_OTHER): Payer: Medicare Other | Admitting: Physician Assistant

## 2020-09-10 ENCOUNTER — Other Ambulatory Visit: Payer: Self-pay

## 2020-09-10 ENCOUNTER — Encounter: Payer: Self-pay | Admitting: Physician Assistant

## 2020-09-10 DIAGNOSIS — D0439 Carcinoma in situ of skin of other parts of face: Secondary | ICD-10-CM

## 2020-09-10 DIAGNOSIS — D485 Neoplasm of uncertain behavior of skin: Secondary | ICD-10-CM

## 2020-09-10 DIAGNOSIS — L570 Actinic keratosis: Secondary | ICD-10-CM

## 2020-09-10 DIAGNOSIS — Z85828 Personal history of other malignant neoplasm of skin: Secondary | ICD-10-CM

## 2020-09-10 DIAGNOSIS — Z1283 Encounter for screening for malignant neoplasm of skin: Secondary | ICD-10-CM

## 2020-09-10 DIAGNOSIS — C4492 Squamous cell carcinoma of skin, unspecified: Secondary | ICD-10-CM

## 2020-09-10 HISTORY — DX: Squamous cell carcinoma of skin, unspecified: C44.92

## 2020-09-10 NOTE — Patient Instructions (Signed)

## 2020-09-11 ENCOUNTER — Encounter: Payer: Self-pay | Admitting: Physician Assistant

## 2020-09-11 NOTE — Progress Notes (Signed)
New Patient   Subjective  Bradley Mcbride is a 70 y.o. male who presents for the following: New Patient (Initial Visit) (Patient here today for skin check, no concerns. Per patient Dr. 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 No family history of atypical moles, melanoma or non mole skin cancer. ).   The following portions of the chart were reviewed this encounter and updated as appropriate:  Tobacco  Allergies  Meds  Problems  Med Hx  Surg Hx  Fam Hx      Objective  Well appearing patient in no apparent distress; mood and affect are within normal limits.  All skin waist up examined.  Left Zygomatic Area Hyperkeratotic scale with pink base        Dorsum of Nose Hyperkeratotic scale with pink base      Mid Forehead Hyperkeratotic scale with pink base      Right Preauricular Area Hyperkeratotic scale with pink base       Assessment & Plan  Neoplasm of uncertain behavior of skin (4) Left Zygomatic Area  Skin / nail biopsy Type of biopsy: tangential   Informed consent: discussed and consent obtained   Timeout: patient name, date of birth, surgical site, and procedure verified   Procedure prep:  Patient was prepped and draped in usual sterile fashion (Non sterile) Prep type:  Chlorhexidine Anesthesia: the lesion was anesthetized in a standard fashion   Anesthetic:  1% lidocaine w/ epinephrine 1-100,000 local infiltration Instrument used: flexible razor blade   Outcome: patient tolerated procedure well   Post-procedure details: wound care instructions given    Specimen 1 - Surgical pathology Differential Diagnosis: bcc vs scc  Check Margins: No  Dorsum of Nose  Skin / nail biopsy Type of biopsy: tangential   Informed consent: discussed and consent obtained   Timeout: patient name, date of birth, surgical site, and procedure verified   Procedure prep:  Patient was prepped and draped in usual sterile fashion (Non  sterile) Prep type:  Chlorhexidine Anesthesia: the lesion was anesthetized in a standard fashion   Anesthetic:  1% lidocaine w/ epinephrine 1-100,000 local infiltration Instrument used: flexible razor blade   Outcome: patient tolerated procedure well   Post-procedure details: wound care instructions given    Specimen 2 - Surgical pathology Differential Diagnosis: bcc vs scc  Check Margins: No  Mid Forehead  Skin / nail biopsy Type of biopsy: tangential   Informed consent: discussed and consent obtained   Timeout: patient name, date of birth, surgical site, and procedure verified   Procedure prep:  Patient was prepped and draped in usual sterile fashion (Non sterile) Prep type:  Chlorhexidine Anesthesia: the lesion was anesthetized in a standard fashion   Anesthetic:  1% lidocaine w/ epinephrine 1-100,000 local infiltration Instrument used: flexible razor blade   Outcome: patient tolerated procedure well   Post-procedure details: wound care instructions given    Specimen 3 - Surgical pathology Differential Diagnosis: bcc vs scc  Check Margins: No  Right Preauricular Area  Skin / nail biopsy Type of biopsy: tangential   Informed consent: discussed and consent obtained   Timeout: patient name, date of birth, surgical site, and procedure verified   Procedure prep:  Patient was prepped and draped in usual sterile fashion (Non sterile) Prep type:  Chlorhexidine Anesthesia: the lesion was anesthetized in a standard fashion   Anesthetic:  1% lidocaine w/ epinephrine 1-100,000 local infiltration Instrument used: flexible razor blade  Outcome: patient tolerated procedure well   Post-procedure details: wound care instructions given    Specimen 4 - Surgical pathology Differential Diagnosis: bcc vs scc  Check Margins: No     I, Simmie Camerer, PA-C, have reviewed all documentation's for this visit.  The documentation on 09/11/20 for the exam, diagnosis, procedures and  orders are all accurate and complete.

## 2020-09-23 ENCOUNTER — Encounter: Payer: Self-pay | Admitting: Physician Assistant

## 2020-09-23 ENCOUNTER — Telehealth: Payer: Self-pay | Admitting: Physician Assistant

## 2020-09-23 NOTE — Telephone Encounter (Signed)
Patient is calling for pathology results from last visit with Kelli Sheffield, PA-C. 

## 2020-09-23 NOTE — Telephone Encounter (Signed)
Phone call to patient with his pathology results. Patient aware of results.  

## 2020-09-23 NOTE — Telephone Encounter (Signed)
-----   Message from Warren Danes, [place] sent at 09/23/2020  8:39 AM EDT ----- 30

## 2020-09-23 NOTE — Telephone Encounter (Signed)
Phone call to patient with his pathology results.

## 2020-10-01 ENCOUNTER — Other Ambulatory Visit: Payer: Self-pay

## 2020-10-01 ENCOUNTER — Ambulatory Visit (INDEPENDENT_AMBULATORY_CARE_PROVIDER_SITE_OTHER): Payer: Medicare Other | Admitting: Physician Assistant

## 2020-10-01 ENCOUNTER — Encounter: Payer: Self-pay | Admitting: Physician Assistant

## 2020-10-01 DIAGNOSIS — D0430 Carcinoma in situ of skin of unspecified part of face: Secondary | ICD-10-CM

## 2020-10-01 DIAGNOSIS — D0439 Carcinoma in situ of skin of other parts of face: Secondary | ICD-10-CM

## 2020-10-01 DIAGNOSIS — D099 Carcinoma in situ, unspecified: Secondary | ICD-10-CM

## 2020-10-01 NOTE — Patient Instructions (Signed)

## 2020-10-07 ENCOUNTER — Encounter: Payer: Self-pay | Admitting: Physician Assistant

## 2020-10-07 NOTE — Progress Notes (Signed)
   Follow-Up Visit   Subjective  Bradley Mcbride is a 70 y.o. male who presents for the following: Procedure (CIS X3 nose, forehead and preauricular).   The following portions of the chart were reviewed this encounter and updated as appropriate:  Tobacco  Allergies  Meds  Problems  Med Hx  Surg Hx  Fam Hx      Objective  Well appearing patient in no apparent distress; mood and affect are within normal limits.  A focused examination was performed including face. Relevant physical exam findings are noted in the Assessment and Plan.  Dorsum of Nose Pink macule. Lesion identified by Robyne Askew and nurse in room.    Mid Forehead Pink macule Lesion identified by Robyne Askew and nurse in room.    Right Preauricular Area Pink macule. Lesion identified by Robyne Askew and nurse in room.     Assessment & Plan  Carcinoma in situ of skin of face, unspecified location (3) Dorsum of Nose  Destruction of lesion Complexity: simple   Destruction method: electrodesiccation and curettage   Informed consent: discussed and consent obtained   Timeout:  patient name, date of birth, surgical site, and procedure verified Anesthesia: the lesion was anesthetized in a standard fashion   Anesthetic:  1% lidocaine w/ epinephrine 1-100,000 local infiltration Curettage performed in three different directions: Yes   Electrodesiccation performed over the curetted area: Yes   Curettage cycles:  3 Final wound size (cm):  1.5 Hemostasis achieved with:  ferric subsulfate Outcome: patient tolerated procedure well with no complications   Additional details:  Wound innoculated with 5 fluorouracil solution.  Mid Forehead  Destruction of lesion Complexity: simple   Destruction method: electrodesiccation and curettage   Informed consent: discussed and consent obtained   Timeout:  patient name, date of birth, surgical site, and procedure verified Anesthesia: the lesion was anesthetized in  a standard fashion   Anesthetic:  1% lidocaine w/ epinephrine 1-100,000 local infiltration Curettage performed in three different directions: Yes   Electrodesiccation performed over the curetted area: Yes   Curettage cycles:  3 Final wound size (cm):  2 Hemostasis achieved with:  ferric subsulfate Outcome: patient tolerated procedure well with no complications   Additional details:  Wound innoculated with 5 fluorouracil solution.  Right Preauricular Area  Destruction of lesion Complexity: simple   Destruction method: electrodesiccation and curettage   Informed consent: discussed and consent obtained   Timeout:  patient name, date of birth, surgical site, and procedure verified Anesthesia: the lesion was anesthetized in a standard fashion   Anesthetic:  1% lidocaine w/ epinephrine 1-100,000 local infiltration Curettage performed in three different directions: Yes   Electrodesiccation performed over the curetted area: Yes   Curettage cycles:  3 Final wound size (cm):  1.3 Hemostasis achieved with:  ferric subsulfate Outcome: patient tolerated procedure well with no complications   Additional details:  Wound innoculated with 5 fluorouracil solution.    I, Katheen Aslin, PA-C, have reviewed all documentation's for this visit.  The documentation on 10/07/20 for the exam, diagnosis, procedures and orders are all accurate and complete.

## 2020-11-03 NOTE — Telephone Encounter (Signed)
Patient called in about his cpap set up. He was asking to have a cpap Rx sent to [facility]. Reached out to the patient to let him know his Rx was sent to to [facility] on November 2021.I reached out to [facility] and was informed the patient was called to be set up last year but he was in [place] and asked that his order be cancelled until he returned to [place]. [facility] has recently re-opened his account because his order will expire on Oct 5. I called patient and LMTCB.

## 2020-11-03 NOTE — Addendum Note (Signed)
Addended by: Freada Bergeron on: 11/03/2020 02:29 PM   Modules accepted: Orders

## 2021-01-04 ENCOUNTER — Encounter: Payer: Self-pay | Admitting: Physician Assistant

## 2021-01-04 ENCOUNTER — Other Ambulatory Visit: Payer: Self-pay

## 2021-01-04 ENCOUNTER — Ambulatory Visit (INDEPENDENT_AMBULATORY_CARE_PROVIDER_SITE_OTHER): Payer: Medicare Other | Admitting: Physician Assistant

## 2021-01-04 DIAGNOSIS — D0439 Carcinoma in situ of skin of other parts of face: Secondary | ICD-10-CM

## 2021-01-04 DIAGNOSIS — C44619 Basal cell carcinoma of skin of left upper limb, including shoulder: Secondary | ICD-10-CM | POA: Diagnosis not present

## 2021-01-04 DIAGNOSIS — L570 Actinic keratosis: Secondary | ICD-10-CM

## 2021-01-04 DIAGNOSIS — D0462 Carcinoma in situ of skin of left upper limb, including shoulder: Secondary | ICD-10-CM | POA: Diagnosis not present

## 2021-01-04 DIAGNOSIS — D485 Neoplasm of uncertain behavior of skin: Secondary | ICD-10-CM

## 2021-01-04 DIAGNOSIS — D0430 Carcinoma in situ of skin of unspecified part of face: Secondary | ICD-10-CM

## 2021-01-04 MED ORDER — TOLAK 4 % EX CREA: TOPICAL_CREAM | CUTANEOUS | 2 refills | Status: DC

## 2021-01-04 NOTE — Patient Instructions (Addendum)
Biopsy, Surgery (Curettage) & Surgery (Excision) Aftercare Instructions  1. Okay to remove bandage in 24 hours  2. Wash area with soap and water  3. Apply Vaseline to area twice daily until healed (Not Neosporin)  4. Okay to cover with a Band-Aid to decrease the chance of infection or prevent irritation from clothing; also it's okay to uncover lesion at home.  5. Suture instructions: return to our office in 7-10 or 10-14 days for a nurse visit for suture removal. Variable healing with sutures, if pain or itching occurs call our office. It's okay to shower or bathe 24 hours after sutures are given.  6. The following risks may occur after a biopsy, curettage or excision: bleeding, scarring, discoloration, recurrence, infection (redness, yellow drainage, pain or swelling).  7. For questions, concerns and results call our office at [facility] before 4pm & Friday before 3pm. Biopsy results will be available in 1 week.   [facility] tomorrow if you do not hear from them today regarding the Tolak prescription @ [phone]     Fluorouracil, 5-FU skin cream or solution What is this medication? FLUOROURACIL, 5-FU (flure oh YOOR a sil) is a chemotherapy agent. It is used on the skin to treat skin cancer and certain types of skin conditions that could become cancer. This medicine may be used for other purposes; ask your health care provider or pharmacist if you have questions. COMMON BRAND NAME(S): Carac, Efudex, Fluoroplex, Tolak What should I tell my care team before I take this medication? They need to know if you have any of these conditions: dihydropyrimidine dehydrogenase (DPD) deficiency an unusual or allergic reaction to fluorouracil, other chemotherapy, other medicines, foods, dyes, or preservatives pregnant or trying to get pregnant breast-feeding How should I use this medication? This medicine is only for use on the skin. Follow the directions on the  prescription label. Wash hands before and after use. Wash affected area and gently pat dry. To apply this medicine use a cotton-tipped applicator, or use gloves if applying with fingertips. If applied with unprotected fingertips, it is very important to wash your hands well after you apply this medicine. Avoid applying to the eyes, nose, or mouth. Apply enough medicine to cover the affected area. You can cover the area with a light gauze dressing, but do not use tight or air-tight dressings. Finish the full course prescribed by your doctor or health care professional, even if you think your condition is better. Do not stop taking except on the advice of your doctor or health care professional. Talk to your pediatrician regarding the use of this medicine in children. Special care may be needed. Overdosage: If you think you have taken too much of this medicine contact a poison control center or emergency room at once. NOTE: This medicine is only for you. Do not share this medicine with others. What if I miss a dose? If you miss a dose, apply it as soon as you can. If it is almost time for your next dose, only use that dose. Do not apply extra doses. Contact your doctor or health care professional if you miss more than one dose. What may interact with this medication? Interactions are not expected. Do not use any other skin products without telling your doctor or health care professional. This list may not describe all possible interactions. Give your health care provider a list of all the medicines, herbs, non-prescription drugs, or dietary supplements you use. Also tell them if you smoke, drink  alcohol, or use illegal drugs. Some items may interact with your medicine. What should I watch for while using this medication? Visit your doctor or health care professional for checks on your progress. You will need to use this medicine for 2 to 6 weeks. This may be longer depending on the condition being treated.  You may not see full healing for another 1 to 2 months after you stop using the medicine. Treated areas of skin can look unsightly during and for several weeks after treatment with this medicine. Do not get this medicine in your eyes. If you do, rinse out with plenty of cool tap water. This medicine can make you more sensitive to the sun. Keep out of the sun. If you cannot avoid being in the sun, wear protective clothing and use sunscreen. Do not use sun lamps or tanning beds/booths. Serious side effects or death can occur if a pet comes into contact with this drug. Contact a vet right away if a pet touches or licks the drug on your skin or comes into contact with the container. Throw away or wash any items used to apply this drug. Wash your hands after applying the drug. Make sure the drug does not get on clothing, carpet, or furniture. If you cannot avoid skin to skin contact with your pet, ask your health care provider if you can cover the area(s) where you apply this drug. Do not become pregnant while taking this medicine or for 1 month after stopping it. Women should inform their doctor if they wish to become pregnant or think they might be pregnant. There is a potential for serious side effects to an unborn child. Talk to your health care professional or pharmacist for more information. What side effects may I notice from receiving this medication? Side effects that you should report to your doctor or health care professional as soon as possible: allergic reactions like skin rash, itching or hives, swelling of the face, lips, or tongue bloody diarrhea fever or chills stomach pain vomiting Side effects that usually do not require medical attention (report to your doctor or health care professional if they continue or are bothersome): redness or dry skin sensitivity to light This list may not describe all possible side effects. Call your doctor for medical advice about side effects. You may  report side effects to FDA at 1-800-FDA-1088. Where should I keep my medication? Keep out of the reach of children and pets. See product for storage instructions. Each product may have different instructions. Throw away any unused medicine after the expiration date. NOTE: This sheet is a summary. It may not cover all possible information. If you have questions about this medicine, talk to your doctor, pharmacist, or health care provider.  2022 [employer]/Gold Standard (2020-10-27 00:00:00)

## 2021-01-05 ENCOUNTER — Encounter: Payer: Self-pay | Admitting: Physician Assistant

## 2021-01-05 NOTE — Progress Notes (Deleted)
Follow-Up Visit   Subjective  Bradley Mcbride is a 70 y.o. male who presents for the following: Follow-up (3 month follow up check more lesions on the chest and back.).   The following portions of the chart were reviewed this encounter and updated as appropriate:  Tobacco  Allergies  Meds  Problems  Med Hx  Surg Hx  Fam Hx      Objective  Well appearing patient in no apparent distress; mood and affect are within normal limits.  All skin waist up examined.  Left Tip of Nose Hyperkeratotic scale with pink base        Left Forehead Hyperkeratotic scale with pink base        Left Shoulder - Anterior Scaly erythematous macule        Left Upper Posterior Shoulder Scaly erythematous macule        Head - Anterior (Face) Erythematous patches with gritty scale.   Assessment & Plan  Neoplasm of uncertain behavior of skin (4) Left Tip of Nose  Skin / nail biopsy Type of biopsy: tangential   Informed consent: discussed and consent obtained   Timeout: patient name, date of birth, surgical site, and procedure verified   Procedure prep:  Patient was prepped and draped in usual sterile fashion (Non sterile) Prep type:  Chlorhexidine Anesthesia: the lesion was anesthetized in a standard fashion   Anesthetic:  1% lidocaine w/ epinephrine 1-100,000 local infiltration Instrument used: flexible razor blade   Outcome: patient tolerated procedure well   Post-procedure details: wound care instructions given    Destruction of lesion Complexity: simple   Destruction method: electrodesiccation and curettage   Informed consent: discussed and consent obtained   Timeout:  patient name, date of birth, surgical site, and procedure verified Anesthesia: the lesion was anesthetized in a standard fashion   Anesthetic:  1% lidocaine w/ epinephrine 1-100,000 local infiltration Curettage performed in three different directions: Yes   Electrodesiccation performed over  the curetted area: Yes   Curettage cycles:  1 Margin per side (cm):  0.1 Final wound size (cm):  1 Hemostasis achieved with:  aluminum chloride and electrodesiccation Outcome: patient tolerated procedure well with no complications   Post-procedure details: sterile dressing applied and wound care instructions given   Dressing type: bandage and petrolatum    Specimen 1 - Surgical pathology Differential Diagnosis: R/O BCC vs SCC - treated after biopsy  Check Margins: No  Left Forehead  Skin / nail biopsy Type of biopsy: tangential   Informed consent: discussed and consent obtained   Timeout: patient name, date of birth, surgical site, and procedure verified   Procedure prep:  Patient was prepped and draped in usual sterile fashion (Non sterile) Prep type:  Chlorhexidine Anesthesia: the lesion was anesthetized in a standard fashion   Anesthetic:  1% lidocaine w/ epinephrine 1-100,000 local infiltration Instrument used: flexible razor blade   Outcome: patient tolerated procedure well   Post-procedure details: wound care instructions given    Destruction of lesion Complexity: simple   Destruction method: electrodesiccation and curettage   Informed consent: discussed and consent obtained   Timeout:  patient name, date of birth, surgical site, and procedure verified Anesthesia: the lesion was anesthetized in a standard fashion   Anesthetic:  1% lidocaine w/ epinephrine 1-100,000 local infiltration Curettage performed in three different directions: Yes   Electrodesiccation performed over the curetted area: Yes   Curettage cycles:  1 Margin per side (cm):  0.1 Final wound size (cm):  1.5 Hemostasis achieved with:  ferric subsulfate and electrodesiccation Outcome: patient tolerated procedure well with no complications   Post-procedure details: sterile dressing applied and wound care instructions given   Dressing type: bandage and petrolatum    Specimen 2 - Surgical  pathology Differential Diagnosis: R/O BCC vs SCC - treated after biopsy  Check Margins: No  Left Shoulder - Anterior  Skin / nail biopsy Type of biopsy: tangential   Informed consent: discussed and consent obtained   Timeout: patient name, date of birth, surgical site, and procedure verified   Procedure prep:  Patient was prepped and draped in usual sterile fashion (Non sterile) Prep type:  Chlorhexidine Anesthesia: the lesion was anesthetized in a standard fashion   Anesthetic:  1% lidocaine w/ epinephrine 1-100,000 local infiltration Instrument used: flexible razor blade   Hemostasis achieved with: ferric subsulfate and electrodesiccation   Outcome: patient tolerated procedure well   Post-procedure details: wound care instructions given    Destruction of lesion Complexity: simple   Destruction method: electrodesiccation and curettage   Informed consent: discussed and consent obtained   Timeout:  patient name, date of birth, surgical site, and procedure verified Anesthesia: the lesion was anesthetized in a standard fashion   Anesthetic:  1% lidocaine w/ epinephrine 1-100,000 local infiltration Curettage performed in three different directions: Yes   Curettage cycles:  1 Margin per side (cm):  0.1 Final wound size (cm):  1.9 Hemostasis achieved with:  ferric subsulfate and electrodesiccation Outcome: patient tolerated procedure well with no complications   Post-procedure details: sterile dressing applied and wound care instructions given   Dressing type: bandage and petrolatum    Specimen 3 - Surgical pathology Differential Diagnosis: R/O BCC vs sCC - treated after biopsy  Check Margins: No  Left Upper Posterior Shoulder  Skin / nail biopsy Type of biopsy: tangential   Informed consent: discussed and consent obtained   Timeout: patient name, date of birth, surgical site, and procedure verified   Procedure prep:  Patient was prepped and draped in usual sterile fashion  (Non sterile) Prep type:  Chlorhexidine Anesthesia: the lesion was anesthetized in a standard fashion   Anesthetic:  1% lidocaine w/ epinephrine 1-100,000 local infiltration Instrument used: flexible razor blade   Hemostasis achieved with: ferric subsulfate and electrodesiccation   Outcome: patient tolerated procedure well   Post-procedure details: wound care instructions given    Destruction of lesion Complexity: simple   Destruction method: electrodesiccation and curettage   Informed consent: discussed and consent obtained   Timeout:  patient name, date of birth, surgical site, and procedure verified Anesthesia: the lesion was anesthetized in a standard fashion   Anesthetic:  1% lidocaine w/ epinephrine 1-100,000 local infiltration Curettage performed in three different directions: Yes   Electrodesiccation performed over the curetted area: Yes   Curettage cycles:  1 Margin per side (cm):  0.1 Final wound size (cm):  2.1 Hemostasis achieved with:  ferric subsulfate and electrodesiccation Outcome: patient tolerated procedure well with no complications   Post-procedure details: sterile dressing applied and wound care instructions given   Dressing type: petrolatum and bandage    Specimen 4 - Surgical pathology Differential Diagnosis: R/O BCC vs SCC - treated after biopsy  Check Margins: No  AK (actinic keratosis) Head - Anterior (Face)  Destruction of lesion - Head - Anterior (Face) Complexity: simple   Destruction method: cryotherapy   Informed consent: discussed and consent obtained   Timeout:  patient name, date of birth, surgical site, and procedure  verified Lesion destroyed using liquid nitrogen: Yes   Cryotherapy cycles:  3 Outcome: patient tolerated procedure well with no complications   Post-procedure details: wound care instructions given    Fluorouracil (TOLAK) 4 % CREA - Head - Anterior (Face) APPLY TO AFFECTED AREA NIGHTLY x 2 weeks    I, Aishah Teffeteller,  PA-C, have reviewed all documentation's for this visit.  The documentation on 01/05/21 for the exam, diagnosis, procedures and orders are all accurate and complete.

## 2021-01-06 NOTE — Progress Notes (Addendum)
Follow-Up Visit   Subjective  Bradley Mcbride is a 70 y.o. male who presents for the following: Follow-up (3 month follow up check more lesions on the chest and back.).   The following portions of the chart were reviewed this encounter and updated as appropriate:  Tobacco  Allergies  Meds  Problems  Med Hx  Surg Hx  Fam Hx      Objective  Well appearing patient in no apparent distress; mood and affect are within normal limits.  All skin waist up examined.  Head - Anterior (Face) Erythematous patches with gritty scale.  Left Shoulder - Anterior Scaly erythematous macule        Left Upper Posterior Shoulder Scaly erythematous macule        Left Tip of Nose Hyperkeratotic scale with pink base        Left Forehead Hyperkeratotic scale with pink base        Assessment & Plan  AK (actinic keratosis) Head - Anterior (Face)  Destruction of lesion - Head - Anterior (Face) Complexity: simple   Destruction method: cryotherapy   Informed consent: discussed and consent obtained   Timeout:  patient name, date of birth, surgical site, and procedure verified Lesion destroyed using liquid nitrogen: Yes   Cryotherapy cycles:  3 Outcome: patient tolerated procedure well with no complications   Post-procedure details: wound care instructions given    Fluorouracil (TOLAK) 4 % CREA - Head - Anterior (Face) APPLY TO AFFECTED AREA NIGHTLY x 2 weeks  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 Left Shoulder - Anterior  Skin / nail biopsy Type of biopsy: tangential   Informed consent: discussed and consent obtained   Timeout: patient name, date of birth, surgical site, and procedure verified   Procedure prep:  Patient was prepped and draped in usual sterile fashion (Non sterile) Prep type:  Chlorhexidine Anesthesia: the lesion was anesthetized in a standard fashion   Anesthetic:  1% lidocaine w/ epinephrine 1-100,000 local  infiltration Instrument used: flexible razor blade   Hemostasis achieved with: ferric subsulfate and electrodesiccation   Outcome: patient tolerated procedure well   Post-procedure details: wound care instructions given    Destruction of lesion Complexity: simple   Destruction method: electrodesiccation and curettage   Informed consent: discussed and consent obtained   Timeout:  patient name, date of birth, surgical site, and procedure verified Anesthesia: the lesion was anesthetized in a standard fashion   Anesthetic:  1% lidocaine w/ epinephrine 1-100,000 local infiltration Curettage performed in three different directions: Yes   Electrodesiccation performed over the curetted area: Yes   Curettage cycles:  1 Margin per side (cm):  0.1 Final wound size (cm):  1.9 Hemostasis achieved with:  ferric subsulfate and electrodesiccation Outcome: patient tolerated procedure well with no complications   Post-procedure details: sterile dressing applied and wound care instructions given   Dressing type: bandage and petrolatum    Specimen 3 - Surgical pathology Differential Diagnosis: R/O BCC vs sCC - treated after biopsy  Check Margins: No  BCC (basal cell carcinoma), shoulder, left Left Upper Posterior Shoulder  Skin / nail biopsy Type of biopsy: tangential   Informed consent: discussed and consent obtained   Timeout: patient name, date of birth, surgical site, and procedure verified   Procedure prep:  Patient was prepped and draped in usual sterile fashion (Non sterile) Prep type:  Chlorhexidine Anesthesia: the lesion was anesthetized in a standard fashion   Anesthetic:  1% lidocaine  w/ epinephrine 1-100,000 local infiltration Instrument used: flexible razor blade   Hemostasis achieved with: ferric subsulfate and electrodesiccation   Outcome: patient tolerated procedure well   Post-procedure details: wound care instructions given    Destruction of lesion Complexity: simple    Destruction method: electrodesiccation and curettage   Informed consent: discussed and consent obtained   Timeout:  patient name, date of birth, surgical site, and procedure verified Anesthesia: the lesion was anesthetized in a standard fashion   Anesthetic:  1% lidocaine w/ epinephrine 1-100,000 local infiltration Curettage performed in three different directions: Yes   Electrodesiccation performed over the curetted area: Yes   Curettage cycles:  1 Margin per side (cm):  0.1 Final wound size (cm):  2.1 Hemostasis achieved with:  ferric subsulfate and electrodesiccation Outcome: patient tolerated procedure well with no complications   Post-procedure details: sterile dressing applied and wound care instructions given   Dressing type: petrolatum and bandage    Specimen 4 - Surgical pathology Differential Diagnosis: R/O BCC vs SCC - treated after biopsy  Check Margins: No  Carcinoma in situ of skin of face, unspecified location (2) Left Tip of Nose  Skin / nail biopsy Type of biopsy: tangential   Informed consent: discussed and consent obtained   Timeout: patient name, date of birth, surgical site, and procedure verified   Procedure prep:  Patient was prepped and draped in usual sterile fashion (Non sterile) Prep type:  Chlorhexidine Anesthesia: the lesion was anesthetized in a standard fashion   Anesthetic:  1% lidocaine w/ epinephrine 1-100,000 local infiltration Instrument used: flexible razor blade   Outcome: patient tolerated procedure well   Post-procedure details: wound care instructions given    Destruction of lesion Complexity: simple   Destruction method: electrodesiccation and curettage   Informed consent: discussed and consent obtained   Timeout:  patient name, date of birth, surgical site, and procedure verified Anesthesia: the lesion was anesthetized in a standard fashion   Anesthetic:  1% lidocaine w/ epinephrine 1-100,000 local infiltration Curettage performed  in three different directions: Yes   Electrodesiccation performed over the curetted area: Yes   Curettage cycles:  1 Margin per side (cm):  0.1 Final wound size (cm):  1 Hemostasis achieved with:  aluminum chloride and electrodesiccation Outcome: patient tolerated procedure well with no complications   Post-procedure details: sterile dressing applied and wound care instructions given   Dressing type: bandage and petrolatum    Specimen 1 - Surgical pathology Differential Diagnosis: R/O BCC vs SCC - treated after biopsy  Check Margins: No  Left Forehead  Skin / nail biopsy Type of biopsy: tangential   Informed consent: discussed and consent obtained   Timeout: patient name, date of birth, surgical site, and procedure verified   Procedure prep:  Patient was prepped and draped in usual sterile fashion (Non sterile) Prep type:  Chlorhexidine Anesthesia: the lesion was anesthetized in a standard fashion   Anesthetic:  1% lidocaine w/ epinephrine 1-100,000 local infiltration Instrument used: flexible razor blade   Outcome: patient tolerated procedure well   Post-procedure details: wound care instructions given    Destruction of lesion Complexity: simple   Destruction method: electrodesiccation and curettage   Informed consent: discussed and consent obtained   Timeout:  patient name, date of birth, surgical site, and procedure verified Anesthesia: the lesion was anesthetized in a standard fashion   Anesthetic:  1% lidocaine w/ epinephrine 1-100,000 local infiltration Curettage performed in three different directions: Yes   Electrodesiccation performed over the  curetted area: Yes   Curettage cycles:  1 Margin per side (cm):  0.1 Final wound size (cm):  1.5 Hemostasis achieved with:  ferric subsulfate and electrodesiccation Outcome: patient tolerated procedure well with no complications   Post-procedure details: sterile dressing applied and wound care instructions given   Dressing  type: bandage and petrolatum    Specimen 2 - Surgical pathology Differential Diagnosis: R/O BCC vs SCC - treated after biopsy  Check Margins: No   I, Jisele Price, PA-C, have reviewed all documentation's for this visit.  The documentation on 01/26/21 for the exam, diagnosis, procedures and orders are all accurate and complete.

## 2021-01-19 ENCOUNTER — Other Ambulatory Visit: Payer: Self-pay

## 2021-01-19 ENCOUNTER — Telehealth: Payer: Self-pay

## 2021-01-19 ENCOUNTER — Encounter: Payer: Self-pay | Admitting: Cardiology

## 2021-01-19 ENCOUNTER — Telehealth (INDEPENDENT_AMBULATORY_CARE_PROVIDER_SITE_OTHER): Payer: Medicare Other | Admitting: Cardiology

## 2021-01-19 VITALS — Ht 70.0 in | Wt 165.0 lb

## 2021-01-19 DIAGNOSIS — G4733 Obstructive sleep apnea (adult) (pediatric): Secondary | ICD-10-CM

## 2021-01-19 NOTE — Telephone Encounter (Signed)
  Patient Consent for Virtual Visit        Bradley Mcbride has provided verbal consent on 01/19/2021 for a virtual visit (video or telephone).   CONSENT FOR VIRTUAL VISIT FOR:  Bradley Mcbride  By participating in this virtual visit I agree to the following:  I hereby voluntarily request, consent and authorize [facility] and its employed or contracted physicians, physician assistants, nurse practitioners or other licensed health care professionals (the Practitioner), to provide me with telemedicine health care services (the "Services") as deemed necessary by the treating Practitioner. I acknowledge and consent to receive the Services by the Practitioner via telemedicine. I understand that the telemedicine visit will involve communicating with the Practitioner through live audiovisual communication technology and the disclosure of certain medical information by electronic transmission. I acknowledge that I have been given the opportunity to request an in-person assessment or other available alternative prior to the telemedicine visit and am voluntarily participating in the telemedicine visit.  I understand that I have the right to withhold or withdraw my consent to the use of telemedicine in the course of my care at any time, without affecting my right to future care or treatment, and that the Practitioner or I may terminate the telemedicine visit at any time. I understand that I have the right to inspect all information obtained and/or recorded in the course of the telemedicine visit and may receive copies of available information for a reasonable fee.  I understand that some of the potential risks of receiving the Services via telemedicine include:  Delay or interruption in medical evaluation due to technological equipment failure or disruption; Information transmitted may not be sufficient (e.g. poor resolution of images) to allow for appropriate medical decision making by the Practitioner;  and/or  In rare instances, security protocols could fail, causing a breach of personal health information.  Furthermore, I acknowledge that it is my responsibility to provide information about my medical history, conditions and care that is complete and accurate to the best of my ability. I acknowledge that Practitioner's advice, recommendations, and/or decision may be based on factors not within their control, such as incomplete or inaccurate data provided by me or distortions of diagnostic images or specimens that may result from electronic transmissions. I understand that the practice of medicine is not an exact science and that Practitioner makes no warranties or guarantees regarding treatment outcomes. I acknowledge that a copy of this consent can be made available to me via my patient portal ([place]), or I can request a printed copy by calling the office of [facility].    I understand that my insurance will be billed for this visit.   I have read or had this consent read to me. I understand the contents of this consent, which adequately explains the benefits and risks of the Services being provided via telemedicine.  I have been provided ample opportunity to ask questions regarding this consent and the Services and have had my questions answered to my satisfaction. I give my informed consent for the services to be provided through the use of telemedicine in my medical care

## 2021-01-19 NOTE — Progress Notes (Signed)
Virtual Visit via Video Note   This visit type was conducted due to national recommendations for restrictions regarding the COVID-19 Pandemic (e.g. social distancing) in an effort to limit this patient's exposure and mitigate transmission in our community.  Due to his co-morbid illnesses, this patient is at least at moderate risk for complications without adequate follow up.  This format is felt to be most appropriate for this patient at this time.  All issues noted in this document were discussed and addressed.  A limited physical exam was performed with this format.  Please refer to the patient's chart for his consent to telehealth for [facility].     Date:  01/19/2021   ID:  Bradley Mcbride, DOB [birth year], MRN [id number] The patient was identified using 2 identifiers.  Patient Location: Home Provider Location: Home Office   PCP:  [name], MD   [facility] Providers Cardiologist:  Cristopher Peru, MD     Evaluation Performed:  Follow-Up Visit  Chief Complaint:  OSA  History of Present Illness:    Bradley Mcbride is a 70 y.o. male with a hx of PAF who was referred for sleep study. He tells me that he really did not have any daytime sleepiness but admits to snoring.  He underwent home sleep study showing severe OSA with an AHI of 39.7/hr and was started on auto CPAP.  He is doing well with his CPAP device and thinks that he has gotten used to it.  He tolerates the full face mask but feels it is leaking.  He feels the pressure is adequate.  Since going on CPAP he feels rested in the am and has no significant daytime sleepiness.  He denies any significant mouth or nasal dryness.  He has some issues with nasal congestion related to a deviated septum.   He does not think that he snores.     The patient does not have symptoms concerning for COVID-19 infection (fever, chills, cough, or new shortness of breath).    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 diff) (Dr. Marla Roe)   Tachycardia    Past Surgical History:  Procedure Laterality Date   CARDIOVERSION N/A 10/06/2017   Procedure: CARDIOVERSION;  Surgeon: Fay Records, MD;  Location: [facility];  Service: Cardiovascular;  Laterality: N/A;   ORBITAL FRACTURE SURGERY     TEE WITHOUT CARDIOVERSION N/A 10/06/2017   Procedure: TRANSESOPHAGEAL ECHOCARDIOGRAM (TEE);  Surgeon: Fay Records, MD;  Location: [facility] ENDOSCOPY;  Service: Cardiovascular;  Laterality: N/A;   UMBILICAL HERNIA REPAIR       Current Meds  Medication Sig   apixaban (ELIQUIS) 5 MG TABS tablet Take 1 tablet (5 mg total) by mouth 2 (two) times daily.   ascorbic acid (VITAMIN C) 500 MG tablet Take 1,000 mg by mouth.   Cholecalciferol 25 MCG (1000 UT) tablet Take by mouth.   Coenzyme Q10 (COQ10 PO) Take 1 capsule by mouth at bedtime.   diltiazem (  CARDIZEM CD) 240 MG 24 hr capsule Take 1 capsule (240 mg total) by mouth daily.   Fluorouracil (TOLAK) 4 % CREA APPLY TO AFFECTED AREA NIGHTLY x 2 weeks   glucosamine-chondroitin 500-400 MG tablet Take 1 tablet by mouth 2 (two) times daily.   memantine (NAMENDA) 10 MG tablet Take 10 mg by mouth 2 (two) times daily.   Multiple Vitamin (MULTIVITAMIN WITH MINERALS) TABS tablet Take 1 tablet by mouth daily.   Omega-3 Fatty Acids (FISH OIL) 1000 MG CAPS Take 2,000 mg by mouth 2 (two) times daily.   pravastatin (PRAVACHOL) 40 MG tablet Take 1 tablet (40 mg total) by mouth every evening.     Allergies:   Atorvastatin    Social History   Tobacco Use   Smoking status: Every Day    Types: Cigarettes   Smokeless tobacco: Never   Tobacco comments:    5 cigarettes daily  Vaping Use   Vaping Use: Never used  Substance Use Topics   Alcohol use: Yes    Alcohol/week: 1.0 - 2.0 standard drink    Types: 1 - 2 Shots of liquor per week    Comment: occasional   Drug use: Never     Family Hx: The patient's family history includes Heart disease in his mother.  ROS:   Please see the history of present illness.     All other systems reviewed and are negative.   Prior CV studies:   The following studies were reviewed today:  Home sleep study  Labs/Other Tests and Data Reviewed:    EKG:  No ECG reviewed.  Recent Labs: No results found for requested labs within last 8760 hours.   Recent Lipid Panel Lab Results  Component Value Date/Time   CHOL 126 05/25/2019 04:18 AM   TRIG 37 05/25/2019 04:18 AM   HDL 76 05/25/2019 04:18 AM   CHOLHDL 1.7 05/25/2019 04:18 AM   LDLCALC 43 05/25/2019 04:18 AM    Wt Readings from Last 3 Encounters:  01/19/21 165 lb (74.8 kg)  12/31/19 168 lb (76.2 kg)  07/03/19 166 lb (75.3 kg)     Risk Assessment/Calculations:    CHA2DS2-VASc Score = 1  This indicates a 0.6% annual risk of stroke. The patient's score is based upon: CHF History: 0 HTN History: 0 Diabetes History: 0 Stroke History: 0 Vascular Disease History: 0 Age Score: 1 Gender Score: 0       Objective:    Vital Signs:  Ht 5\' 10"  (1.778 m)   Wt 165 lb (74.8 kg)   BMI 23.68 kg/m    VITAL SIGNS:  reviewed GEN:  no acute distress EYES:  sclerae anicteric, EOMI - Extraocular Movements Intact RESPIRATORY:  normal respiratory effort, symmetric expansion CARDIOVASCULAR:  no peripheral edema SKIN:  no rash, lesions or ulcers. MUSCULOSKELETAL:  no obvious deformities. NEURO:  alert and oriented x 3, no obvious focal deficit PSYCH:  normal affect  ASSESSMENT & PLAN:     OSA - The patient  is tolerating PAP therapy well without any problems. The PAP download performed by his DME was personally reviewed and interpreted by me today and showed an AHI of 7/hr on auto PAP cm H2O with 100% compliance in using more than 4 hours nightly.  The patient has been using and benefiting from PAP use and will continue to benefit from therapy.  -he appears to have a mask leak so I will get him an appt with DME to check his mask   COVID-19 Education:  The signs and symptoms of COVID-19 were discussed with the patient and how to seek care for testing (follow up with PCP or arrange E-visit).  The importance of social distancing was discussed today.  Time:   Today, I have spent 15 minutes with the patient with telehealth technology discussing the above problems.     Medication Adjustments/Labs and Tests Ordered: Current medicines are reviewed at length with the patient today.  Concerns regarding medicines are outlined above.   Tests Ordered: No orders of the defined types were placed in this encounter.   Medication Changes: No orders of the defined types were placed in this encounter.   Follow Up:  In Person in 1 year(s)  Signed, Fransico Him, MD  01/19/2021 10:09 AM    [place]

## 2021-01-22 ENCOUNTER — Telehealth: Payer: Self-pay | Admitting: *Deleted

## 2021-01-22 DIAGNOSIS — G4733 Obstructive sleep apnea (adult) (pediatric): Secondary | ICD-10-CM

## 2021-01-22 NOTE — Telephone Encounter (Signed)
-----   Message from Antonieta Iba, [place] sent at 01/19/2021 10:12 AM EST ----- Set up with DME for PAP mask fit due to large leak on download - he is going out of town for 3 months so this needs to be done ASAP/  Thanks!

## 2021-01-22 NOTE — Telephone Encounter (Signed)
Order placed to [facility] via fax.

## 2021-02-08 ENCOUNTER — Ambulatory Visit (INDEPENDENT_AMBULATORY_CARE_PROVIDER_SITE_OTHER): Payer: Medicare Other | Admitting: Physician Assistant

## 2021-02-08 ENCOUNTER — Encounter: Payer: Self-pay | Admitting: Physician Assistant

## 2021-02-08 ENCOUNTER — Other Ambulatory Visit: Payer: Self-pay

## 2021-02-08 DIAGNOSIS — Z1283 Encounter for screening for malignant neoplasm of skin: Secondary | ICD-10-CM | POA: Diagnosis not present

## 2021-02-08 DIAGNOSIS — C44529 Squamous cell carcinoma of skin of other part of trunk: Secondary | ICD-10-CM

## 2021-02-08 DIAGNOSIS — L570 Actinic keratosis: Secondary | ICD-10-CM

## 2021-02-08 DIAGNOSIS — D0439 Carcinoma in situ of skin of other parts of face: Secondary | ICD-10-CM

## 2021-02-08 DIAGNOSIS — Z85828 Personal history of other malignant neoplasm of skin: Secondary | ICD-10-CM | POA: Diagnosis not present

## 2021-02-08 DIAGNOSIS — D0430 Carcinoma in situ of skin of unspecified part of face: Secondary | ICD-10-CM

## 2021-02-08 NOTE — Patient Instructions (Signed)

## 2021-02-08 NOTE — Progress Notes (Deleted)
Follow-Up Visit   Subjective  Bradley Mcbride is a 70 y.o. male who presents for the following: Annual Exam (Lesion on right chest- needs removed- did 4 bx's last office visit so had to come back ). He has had some more crusts and a large, painful growth on his chest.   The following portions of the chart were reviewed this encounter and updated as appropriate:  Tobacco   Allergies   Meds   Problems   Med Hx   Surg Hx   Fam Hx       Objective  Well appearing patient in no apparent distress; mood and affect are within normal limits.  All skin waist up examined.  Right Chest Volcano growth on pink base        Left Parotid Area Hyperkeratotic scale with pink base        Left Zygomatic Area (4), Right Malar Cheek Erythematous patches with gritty scale.   Assessment & Plan  Neoplasm of uncertain behavior of skin (2) Right Chest  Skin / nail biopsy Type of biopsy: tangential   Informed consent: discussed and consent obtained   Timeout: patient name, date of birth, surgical site, and procedure verified   Procedure prep:  Patient was prepped and draped in usual sterile fashion (Non sterile) Prep type:  Chlorhexidine Anesthesia: the lesion was anesthetized in a standard fashion   Anesthetic:  1% lidocaine w/ epinephrine 1-100,000 local infiltration Instrument used: flexible razor blade   Outcome: patient tolerated procedure well   Post-procedure details: wound care instructions given    Destruction of lesion Complexity: simple   Destruction method: electrodesiccation and curettage   Informed consent: discussed and consent obtained   Timeout:  patient name, date of birth, surgical site, and procedure verified Anesthesia: the lesion was anesthetized in a standard fashion   Anesthetic:  1% lidocaine w/ epinephrine 1-100,000 local infiltration Curettage performed in three different directions: Yes   Electrodesiccation performed over the curetted area: Yes    Curettage cycles:  3 Margin per side (cm):  0.1 Final wound size (cm):  1.4 Hemostasis achieved with:  aluminum chloride Outcome: patient tolerated procedure well with no complications   Post-procedure details: wound care instructions given    Specimen 1 - Surgical pathology Differential Diagnosis: bcc vs scc- txpbx  Check Margins: No  Left Parotid Area  Skin / nail biopsy Type of biopsy: tangential   Informed consent: discussed and consent obtained   Timeout: patient name, date of birth, surgical site, and procedure verified   Procedure prep:  Patient was prepped and draped in usual sterile fashion (Non sterile) Prep type:  Chlorhexidine Anesthesia: the lesion was anesthetized in a standard fashion   Anesthetic:  1% lidocaine w/ epinephrine 1-100,000 local infiltration Instrument used: flexible razor blade   Outcome: patient tolerated procedure well   Post-procedure details: wound care instructions given    Destruction of lesion Complexity: simple   Destruction method: electrodesiccation and curettage   Informed consent: discussed and consent obtained   Timeout:  patient name, date of birth, surgical site, and procedure verified Anesthesia: the lesion was anesthetized in a standard fashion   Anesthetic:  1% lidocaine w/ epinephrine 1-100,000 local infiltration Curettage performed in three different directions: Yes   Electrodesiccation performed over the curetted area: Yes   Curettage cycles:  3 Margin per side (cm):  0.1 Final wound size (cm):  1.5 Hemostasis achieved with:  aluminum chloride Outcome: patient tolerated procedure well with no complications  Post-procedure details: wound care instructions given    Specimen 2 - Surgical pathology Differential Diagnosis: bcc vs scc-txpbx  Check Margins: No  AK (actinic keratosis) (5) Left Zygomatic Area (4); Right Malar Cheek  Destruction of lesion - Left Zygomatic Area, Right Malar Cheek Complexity: simple    Destruction method: cryotherapy   Informed consent: discussed and consent obtained   Timeout:  patient name, date of birth, surgical site, and procedure verified Lesion destroyed using liquid nitrogen: Yes   Cryotherapy cycles:  3 Outcome: patient tolerated procedure well with no complications    Related Medications Fluorouracil (TOLAK) 4 % CREA APPLY TO AFFECTED AREA NIGHTLY x 2 weeks    I, Ianna Salmela, PA-C, have reviewed all documentation's for this visit.  The documentation on 02/08/21 for the exam, diagnosis, procedures and orders are all accurate and complete.

## 2021-02-24 ENCOUNTER — Telehealth: Payer: Self-pay

## 2021-02-24 NOTE — Telephone Encounter (Signed)
-----   Message from Warren Danes, [place] sent at 02/24/2021  7:42 AM EST ----- Treated with biopsy. Follow up when he returns from [place].

## 2021-02-24 NOTE — Telephone Encounter (Signed)
Lmtc 1/4      ----- Message from Warren Danes, PA-C sent at 02/24/2021  7:42 AM EST ----- Treated with biopsy. Follow up when he returns from [place].

## 2021-02-25 NOTE — Progress Notes (Signed)
Follow-Up Visit   Subjective  Bradley Mcbride is a 71 y.o. male who presents for the following: Annual Exam (Lesion on right chest- needs removed- did 4 bx's last office visit so had to come back ).   The following portions of the chart were reviewed this encounter and updated as appropriate:  Tobacco   Allergies   Meds   Problems   Med Hx   Surg Hx   Fam Hx       Objective  Well appearing patient in no apparent distress; mood and affect are within normal limits.  All skin waist up examined.  Left Zygomatic Area (4), Right Malar Cheek Erythematous patches with gritty scale.  Right Chest Volcano growth on pink base        Left Parotid Area Hyperkeratotic scale with pink base         Assessment & Plan  AK (actinic keratosis) (5) Left Zygomatic Area (4); Right Malar Cheek  Destruction of lesion - Left Zygomatic Area, Right Malar Cheek Complexity: simple   Destruction method: cryotherapy   Informed consent: discussed and consent obtained   Timeout:  patient name, date of birth, surgical site, and procedure verified Lesion destroyed using liquid nitrogen: Yes   Cryotherapy cycles:  3 Outcome: patient tolerated procedure well with no complications    Related Medications Fluorouracil (TOLAK) 4 % CREA APPLY TO AFFECTED AREA NIGHTLY x 2 weeks  SCC (squamous cell carcinoma), trunk Right Chest  Skin / nail biopsy Type of biopsy: tangential   Informed consent: discussed and consent obtained   Timeout: patient name, date of birth, surgical site, and procedure verified   Procedure prep:  Patient was prepped and draped in usual sterile fashion (Non sterile) Prep type:  Chlorhexidine Anesthesia: the lesion was anesthetized in a standard fashion   Anesthetic:  1% lidocaine w/ epinephrine 1-100,000 local infiltration Instrument used: flexible razor blade   Outcome: patient tolerated procedure well   Post-procedure details: wound care instructions given     Destruction of lesion Complexity: simple   Destruction method: electrodesiccation and curettage   Informed consent: discussed and consent obtained   Timeout:  patient name, date of birth, surgical site, and procedure verified Anesthesia: the lesion was anesthetized in a standard fashion   Anesthetic:  1% lidocaine w/ epinephrine 1-100,000 local infiltration Curettage performed in three different directions: Yes   Electrodesiccation performed over the curetted area: Yes   Curettage cycles:  3 Margin per side (cm):  0.1 Final wound size (cm):  1.4 Hemostasis achieved with:  aluminum chloride Outcome: patient tolerated procedure well with no complications   Post-procedure details: wound care instructions given    Specimen 1 - Surgical pathology Differential Diagnosis: bcc vs scc- txpbx  Check Margins: No  Carcinoma in situ of skin of face, unspecified location Left Parotid Area  Skin / nail biopsy Type of biopsy: tangential   Informed consent: discussed and consent obtained   Timeout: patient name, date of birth, surgical site, and procedure verified   Procedure prep:  Patient was prepped and draped in usual sterile fashion (Non sterile) Prep type:  Chlorhexidine Anesthesia: the lesion was anesthetized in a standard fashion   Anesthetic:  1% lidocaine w/ epinephrine 1-100,000 local infiltration Instrument used: flexible razor blade   Outcome: patient tolerated procedure well   Post-procedure details: wound care instructions given    Destruction of lesion Complexity: simple   Destruction method: electrodesiccation and curettage   Informed consent: discussed and consent obtained  Timeout:  patient name, date of birth, surgical site, and procedure verified Anesthesia: the lesion was anesthetized in a standard fashion   Anesthetic:  1% lidocaine w/ epinephrine 1-100,000 local infiltration Curettage performed in three different directions: Yes   Electrodesiccation performed  over the curetted area: Yes   Curettage cycles:  3 Margin per side (cm):  0.1 Final wound size (cm):  1.5 Hemostasis achieved with:  aluminum chloride Outcome: patient tolerated procedure well with no complications   Post-procedure details: wound care instructions given    Specimen 2 - Surgical pathology Differential Diagnosis: bcc vs scc-txpbx  Check Margins: No    I, Saxon Barich, PA-C, have reviewed all documentation's for this visit.  The documentation on 02/25/21 for the exam, diagnosis, procedures and orders are all accurate and complete.

## 2021-03-01 ENCOUNTER — Telehealth: Payer: Self-pay

## 2021-03-01 NOTE — Telephone Encounter (Signed)
Phone call to patient with his pathology results. Patient aware of results.  

## 2021-03-01 NOTE — Telephone Encounter (Signed)
-----   Message from Warren Danes, [place] sent at 02/24/2021  7:42 AM EST ----- Treated with biopsy. Follow up when he returns from [place].

## 2021-08-04 ENCOUNTER — Other Ambulatory Visit (HOSPITAL_COMMUNITY): Payer: Self-pay | Admitting: Neurology

## 2021-08-04 DIAGNOSIS — R2689 Other abnormalities of gait and mobility: Secondary | ICD-10-CM

## 2021-08-04 DIAGNOSIS — R279 Unspecified lack of coordination: Secondary | ICD-10-CM

## 2021-08-13 ENCOUNTER — Ambulatory Visit (HOSPITAL_COMMUNITY)
Admission: RE | Admit: 2021-08-13 | Discharge: 2021-08-13 | Disposition: A | Discharge status: Home / Self Care | Payer: Medicare Other | Source: Ambulatory Visit | Attending: Neurology | Admitting: Neurology

## 2021-08-13 DIAGNOSIS — R279 Unspecified lack of coordination: Secondary | ICD-10-CM | POA: Diagnosis present

## 2021-08-13 DIAGNOSIS — R2689 Other abnormalities of gait and mobility: Secondary | ICD-10-CM | POA: Insufficient documentation

## 2021-08-13 MED ORDER — IOFLUPANE I 123 185 MBQ/2.5ML IV SOLN
4.8000 | Freq: Once | INTRAVENOUS | Status: AC | PRN
  Administered 2021-08-13: 4.8 via INTRAVENOUS
  Filled 2021-08-13: qty 5

## 2021-08-13 MED ORDER — POTASSIUM IODIDE (ANTIDOTE) 130 MG PO TABS
ORAL_TABLET | ORAL | Status: AC
  Administered 2021-08-13: 130 mg
  Filled 2021-08-13: qty 1

## 2021-08-13 MED ORDER — POTASSIUM IODIDE (ANTIDOTE) 130 MG PO TABS: 130.0000 mg | ORAL_TABLET | Freq: Once | ORAL | Status: DC

## 2021-11-29 ENCOUNTER — Telehealth: Payer: Self-pay

## 2021-11-29 DIAGNOSIS — I4892 Unspecified atrial flutter: Secondary | ICD-10-CM

## 2021-11-29 NOTE — Patient Outreach (Signed)
Received a call from Mrs. Carla Drape, Mr. Merrow's wife, she is needing help with Mr. Parcell.   I have sent a referral to the a Nurse Care Coordinator, Social worker and the [place].      [facility], [place], [place] [facility] [phone]

## 2021-11-30 ENCOUNTER — Telehealth: Payer: Self-pay | Admitting: *Deleted

## 2021-11-30 NOTE — Chronic Care Management (AMB) (Signed)
  Care Coordination  Outreach Note  11/30/2021 Name: Bradley Mcbride MRN: [id number] DOB: 1950-03-15   Care Coordination Outreach Attempts: An unsuccessful telephone outreach was attempted today to offer the patient information about available care coordination services as a benefit of their health plan.  (To schedule referral with RNCM and SW )  Follow Up Plan:  Additional outreach attempts will be made to offer the patient care coordination information and services.   Encounter Outcome:  No Answer  [place]  Direct Dial: [phone]

## 2021-12-02 NOTE — Chronic Care Management (AMB) (Signed)
  Care Coordination   Note   12/02/2021 Name: ALBI RAPPAPORT MRN: [id number] DOB: 1950/06/26  LOUAY MYRIE is a 71 y.o. year old male who sees [name], MD for primary care. I reached out to Elliot Dally by phone today to offer care coordination services.  Mr. Papadakis was given information about Care Coordination services today including:   The Care Coordination services include support from the care team which includes your Nurse Coordinator, Clinical Social Worker, or Pharmacist.  The Care Coordination team is here to help remove barriers to the health concerns and goals most important to you. Care Coordination services are voluntary, and the patient may decline or stop services at any time by request to their care team member.   Care Coordination Consent Status: Patient spouse [name] agreed to services and verbal consent obtained.   Follow up plan:  Telephone appointment with care coordination team member scheduled for:  12/07/21  Encounter Outcome:  Pt. Scheduled  [place]  Direct Dial: [phone]

## 2021-12-07 ENCOUNTER — Ambulatory Visit: Payer: Self-pay

## 2021-12-07 NOTE — Patient Instructions (Signed)
Visit Information  Thank you for taking time to visit with me today. Please don't hesitate to contact me if I can be of assistance to you.   Following are the goals we discussed today:   Goals Addressed             This Visit's Progress    Care Coordination Activities       Care Coordination Interventions: [facility] screening performed - no acute resource challenges identified at this time Discussed the patient was diagnosed with Primary Progressive Aphasia approximately 3.5 years ago and has had a steady decline Patient currently lives with spouse and is unable to participate in fine motor skills such as buttons, zippers, socks/shoes, cutting food, and is also unable to speak Determined patients spouse would like to keep him home as long as possible but would like to learn about options for care both in the home and outside the home for when it may be needed Verbal and written education provided on [place], difference between assisted living and skilled nursing including levels of care and funding sources Education on the role of the [place] provided by phone - no engagement with RN desired at this time Discussed plan for SW to follow up over the next three weeks to confirm receipt of resources and review options as needed         Our next appointment is by telephone on 11/7 at 9:30 am  Please call the care guide team at [phone] if you need to cancel or reschedule your appointment.   If you are experiencing a Mental Health or [place] or need someone to talk to, please call 1-800-273-TALK (toll free, 24 hour hotline)  The patient verbalized understanding of instructions, educational materials, and care plan provided today and DECLINED offer to receive copy of patient instructions, educational materials, and care plan.   Telephone follow up appointment with care management team member scheduled for:11/7   Daneen Schick, BSW, CDP Social Worker, Certified Dementia Practitioner [place] Management  Care Coordination [phone]

## 2021-12-07 NOTE — Patient Outreach (Signed)
  Care Coordination   Initial Visit Note   12/07/2021 Name: Bradley Mcbride MRN: [id number] DOB: September 15, 1950  Bradley Mcbride is a 71 y.o. year old male who sees [name], MD for primary care. I  spoke with patients wife and caregiver Bradley Mcbride by phone.  What matters to the patients health and wellness today?  To learn more about options for care as patients disease progresses.    Goals Addressed             This Visit's Progress    Care Coordination Activities       Care Coordination Interventions: [facility] screening performed - no acute resource challenges identified at this time Discussed the patient was diagnosed with Primary Progressive Aphasia approximately 3.5 years ago and has had a steady decline Patient currently lives with spouse and is unable to participate in fine motor skills such as buttons, zippers, socks/shoes, cutting food, and is also unable to speak Determined patients spouse would like to keep him home as long as possible but would like to learn about options for care both in the home and outside the home for when it may be needed Verbal and written education provided on [place], difference between assisted living and skilled nursing including levels of care and funding sources Education on the role of the [place] provided by phone - no engagement with RN desired at this time Discussed plan for SW to follow up over the next three weeks to confirm receipt of resources and review options as needed         SDOH assessments and interventions completed:  Yes  SDOH Interventions Today    Flowsheet Row Most Recent Value  SDOH Interventions   Food Insecurity Interventions Intervention Not Indicated  Housing Interventions Intervention Not Indicated  Transportation Interventions Intervention Not Indicated  Utilities Interventions Intervention Not Indicated        Care Coordination Interventions  Activated:  Yes  Care Coordination Interventions:  Yes, provided   Follow up plan: Follow up call scheduled for 11/7    Encounter Outcome:  Pt. Visit Completed   Daneen Schick, Arita Miss, CDP Social Worker, Certified Dementia Practitioner [facility]  Care Coordination [phone]

## 2021-12-16 ENCOUNTER — Ambulatory Visit: Payer: Self-pay

## 2021-12-16 NOTE — Patient Outreach (Signed)
  Care Coordination   Follow Up Visit Note   12/16/2021 Name: SIVAN QUAST MRN: [id number] DOB: 1950/03/28  XHAIDEN COOMBS is a 71 y.o. year old male who sees [name], MD for primary care. I  spoke with patients spouse and caregiver [name] by phone.  What matters to the patients health and wellness today?  To obtain home health services    Goals Addressed             This Visit's Progress    Care Coordination Activities       Care Coordination Interventions: Determined the patient was seen by his primary care provider on Friday 10/20 and discussed in home PT/OT Advised by spouse she would like SW and PT to come to the home to assist with patient needs Discussed care coordination is not home health but SW will outreach the patients primary care provider to confirm home health orders are placed Contacted Dr. Ival Bible office to determine the patient does not have an active home health order Determined the patients primary care office will contact the patient and his spouse to review patient needs related to home health         SDOH assessments and interventions completed:  No     Care Coordination Interventions Activated:  Yes  Care Coordination Interventions:  Yes, provided   Follow up plan: Follow up call scheduled for 11/7    Encounter Outcome:  Pt. Visit Completed   Daneen Schick, Arita Miss, CDP Social Worker, Certified Dementia Practitioner [place]  Care Coordination [phone]

## 2021-12-16 NOTE — Patient Instructions (Signed)
Visit Information  Thank you for taking time to visit with me today. Please don't hesitate to contact me if I can be of assistance to you.   Following are the goals we discussed today:   Goals Addressed             This Visit's Progress    Care Coordination Activities       Care Coordination Interventions: Determined the patient was seen by his primary care provider on Friday 10/20 and discussed in home PT/OT Advised by spouse she would like SW and PT to come to the home to assist with patient needs Discussed care coordination is not home health but SW will outreach the patients primary care provider to confirm home health orders are placed Contacted Dr. Ival Bible office to determine the patient does not have an active home health order Determined the patients primary care office will contact the patient and his spouse to review patient needs related to home health         Our next appointment is by telephone on 11/7 at 9:30 am  Please call the care guide team at [phone] if you need to cancel or reschedule your appointment.   If you are experiencing a Mental Health or [place] or need someone to talk to, please call 1-800-273-TALK (toll free, 24 hour hotline)  The patient verbalized understanding of instructions, educational materials, and care plan provided today and DECLINED offer to receive copy of patient instructions, educational materials, and care plan.   Telephone follow up appointment with care management team member scheduled for:11/7  Daneen Schick, BSW, CDP Social Worker, Certified Dementia Practitioner [facility]  Care Coordination [phone]

## 2021-12-28 ENCOUNTER — Ambulatory Visit: Payer: Self-pay

## 2021-12-28 NOTE — Patient Outreach (Signed)
  Care Coordination   Follow Up Visit Note   12/28/2021 Name: Bradley Mcbride MRN: [id number] DOB: [birth year]  Bradley Mcbride is a 71 y.o. year old male who sees Bradley Bien, MD for primary care. I  spoke with patients spouse and caregiver by phone  What matters to the patients health and wellness today?  I want him to have home health    Goals Addressed             This Visit's Progress    Care Coordination Activities       Care Coordination Interventions: Discussed the patients spouse was contacted by his primary care providers office and given the number to [employer] - spouse contacted but was told they did not qualify [place] offers home repair needs and not home health Confirmed spouse wants home health PT/OT to assist with strength and gait Advised SW would contact patients primary care providers office again requesting orders, spouse aware patient may need an office visit Education provided on home health services covered under Medicare Confirmed receipt of mailed resources - no questions at this time Scheduled follow up call over the next two weeks Outbound call placed to patients primary care providers office - voice message left on nurse line requesting home health orders for PT/OT as requested by patients spouse        SDOH assessments and interventions completed:  No     Care Coordination Interventions Activated:  Yes  Care Coordination Interventions:  Yes, provided   Follow up plan: Follow up call scheduled for 11/21    Encounter Outcome:  Pt. Visit Completed   Bradley Mcbride, Bradley Mcbride, CDP Social Worker, Certified Dementia Practitioner [facility]  Care Coordination [phone]

## 2021-12-28 NOTE — Patient Instructions (Signed)
Visit Information  Thank you for taking time to visit with me today. Please don't hesitate to contact me if I can be of assistance to you.   Following are the goals we discussed today:   Goals Addressed             This Visit's Progress    Care Coordination Activities       Care Coordination Interventions: Discussed the patients spouse was contacted by his primary care providers office and given the number to [employer] - spouse contacted but was told they did not qualify [place] offers home repair needs and not home health Confirmed spouse wants home health PT/OT to assist with strength and gait Advised SW would contact patients primary care providers office again requesting orders, spouse aware patient may need an office visit Education provided on home health services covered under Medicare Confirmed receipt of mailed resources - no questions at this time Scheduled follow up call over the next two weeks Outbound call placed to patients primary care providers office - voice message left on nurse line requesting home health orders for PT/OT as requested by patients spouse        Our next appointment is by telephone on 11/21 at 11:00 am  Please call the care guide team at [phone] if you need to cancel or reschedule your appointment.   If you are experiencing a Mental Health or [place] or need someone to talk to, please call 1-800-273-TALK (toll free, 24 hour hotline)  The patient verbalized understanding of instructions, educational materials, and care plan provided today and DECLINED offer to receive copy of patient instructions, educational materials, and care plan.   Telephone follow up appointment with care management team member scheduled for:11/21  Daneen Schick, Arita Miss, CDP Social Worker, Certified Dementia Practitioner [place] [facility] [facility]  Care Coordination [phone]

## 2022-01-11 ENCOUNTER — Ambulatory Visit: Payer: Self-pay

## 2022-01-11 NOTE — Patient Outreach (Addendum)
  Care Coordination   Follow Up Visit Note   01/11/2022 Name: Bradley Mcbride MRN: [id number] DOB: 05/18/1950  Bradley Mcbride is a 71 y.o. year old male who sees [name], MD for primary care. I  spoke with patients spouse and caregiver [name] by phone.  What matters to the patients health and wellness today?  I would like more help in the home    Goals Addressed             This Visit's Progress    Care Coordination Activities       Care Coordination Interventions: Determined the patient has yet to receive home health services Discussed plan for SW to outreach patients primary care providers office to follow up on if orders for PT/OT have been placed Education provided on the role of home health and that this is a short-term service Discussed the patients spouse would like to continue with the plan to keep the patient home as long as possible Determined in home aid services would be helpful to assist with daily care needs - patient does have long term care insurance Advised SW will mail a list of in home aid agencies to the patients home for the spouse to contact to ensure insurance is accepted for services Outbound call placed to patients primary care providers office - voice message left on nurse line requesting feedback on status of home health referral Inbound call received from [place] with Dr. Ival Bible office advising home health orders have been [facility] to [facility] for PT/OT Advised SW will follow up with [facility]. Also advised SW is assisting spouse with identifying in home aid providers who will accept long term care insurance        SDOH assessments and interventions completed:  No     Care Coordination Interventions Activated:  Yes  Care Coordination Interventions:  Yes, provided   Follow up plan: Follow up call scheduled for 11/28    Encounter Outcome:  Pt. Visit Completed   Daneen Schick, Arita Miss, CDP Social Worker, Certified  Dementia Practitioner [facility] [facility]  Care Coordination [phone]

## 2022-01-11 NOTE — Patient Instructions (Signed)
Visit Information  Thank you for taking time to visit with me today. Please don't hesitate to contact me if I can be of assistance to you.   Following are the goals we discussed today:   Goals Addressed             This Visit's Progress    Care Coordination Activities       Care Coordination Interventions: Determined the patient has yet to receive home health services Discussed plan for SW to outreach patients primary care providers office to follow up on if orders for PT/OT have been placed Education provided on the role of home health and that this is a short-term service Discussed the patients spouse would like to continue with the plan to keep the patient home as long as possible Determined in home aid services would be helpful to assist with daily care needs - patient does have long term care insurance Advised SW will mail a list of in home aid agencies to the patients home for the spouse to contact to ensure insurance is accepted for services Outbound call placed to patients primary care providers office - voice message left on nurse line requesting feedback on status of home health referral        Our next appointment is by telephone on 11/28 at 3:00 pm  Please call the care guide team at [phone] if you need to cancel or reschedule your appointment.   If you are experiencing a Mental Health or [place] or need someone to talk to, please call 1-800-273-TALK (toll free, 24 hour hotline)  The patient verbalized understanding of instructions, educational materials, and care plan provided today and DECLINED offer to receive copy of patient instructions, educational materials, and care plan.   Telephone follow up appointment with care management team member scheduled for:11/28  Daneen Schick, [place], CDP Social Worker, Certified Dementia Practitioner [place] [facility]  Care Coordination [phone]

## 2022-01-18 ENCOUNTER — Ambulatory Visit: Payer: Self-pay

## 2022-01-18 NOTE — Patient Instructions (Signed)
Visit Information  Thank you for taking time to visit with me today. Please don't hesitate to contact me if I can be of assistance to you.   Following are the goals we discussed today:   Goals Addressed             This Visit's Progress    COMPLETED: Care Coordination Activities       Care Coordination Interventions: Confirmed patient seen by home health PT  Discussed the physical therapist does not feel patient is in need of PT after evaluation but does agree with need for OT Patient will be seen by home health OT  Reviewed spouse would like to move forward with hiring in home assistance using long term care insurance Educated [name] on the process of contacting an agency to request an assessment and discuss process for filing with long term care insurance Provided [place] with four home care agencies in the [place] area to contact for services Discussed the patient and his spouse travel to [place] from January to April very year to stay with their daughter - [name] would like to continue this until patient is no longer able to Provided [name] with an agency in [place] that accepts long term care insurance to contact regarding assistance while in [place] Discussed plan for [place] to contact SW directly as needed with future care coordination needs        If you are experiencing a [place] or [place] or need someone to talk to, please go to [facility] [place] [phone])  The patient verbalized understanding of instructions, educational materials, and care plan provided today and DECLINED offer to receive copy of patient instructions, educational materials, and care plan.   No further follow up required: Please contact me as needed.  Daneen Schick, BSW, CDP Social Worker, Certified Dementia Practitioner [place] [facility]  Care Coordination [phone]

## 2022-01-18 NOTE — Patient Outreach (Signed)
  Care Coordination   Follow Up Visit Note   01/18/2022 Name: Bradley Mcbride MRN: [id number] DOB: [birth year]  Bradley Mcbride is a 71 y.o. year old male who sees [name], MD for primary care. I  spoke with patients wife and caregiver Bradley Mcbride by phone.  What matters to the patients health and wellness today?  I would like a caregiver in the home    Goals Addressed             This Visit's Progress    COMPLETED: Care Coordination Activities       Care Coordination Interventions: Confirmed patient seen by home health PT  Discussed the physical therapist does not feel patient is in need of PT after evaluation but does agree with need for OT Patient will be seen by home health OT  Reviewed spouse would like to move forward with hiring in home assistance using long term care insurance Educated Bradley Mcbride on the process of contacting an agency to request an assessment and discuss process for filing with long term care insurance Provided [place] with four home care agencies in the [place] area to contact for services Discussed the patient and his spouse travel to [place] from January to April very year to stay with their daughter - Bradley Mcbride would like to continue this until patient is no longer able to Provided Bradley Mcbride with an agency in [place] that accepts long term care insurance to contact regarding assistance while in [place] Discussed plan for [place] to contact SW directly as needed with future care coordination needs        SDOH assessments and interventions completed:  No     Care Coordination Interventions:  Yes, provided   Follow up plan: No further intervention required.   Encounter Outcome:  Pt. Visit Completed   Daneen Schick, BSW, CDP Social Worker, Certified Dementia Practitioner [place] [facility]  Care Coordination [phone]

## 2022-03-15 ENCOUNTER — Ambulatory Visit: Payer: Medicare Other | Attending: Cardiology | Admitting: Cardiology

## 2022-03-15 ENCOUNTER — Telehealth: Payer: Self-pay

## 2022-03-15 ENCOUNTER — Encounter: Payer: Self-pay | Admitting: Cardiology

## 2022-03-15 VITALS — BP 110/70 | HR 75 | Ht 69.0 in

## 2022-03-15 DIAGNOSIS — G4733 Obstructive sleep apnea (adult) (pediatric): Secondary | ICD-10-CM | POA: Insufficient documentation

## 2022-03-15 NOTE — Patient Instructions (Signed)
Medication Instructions:  Your physician recommends that you continue on your current medications as directed. Please refer to the Current Medication list given to you today.  *If you need a refill on your cardiac medications before your next appointment, please call your pharmacy*   Follow-Up: At [facility], you and your health needs are our priority.  As part of our continuing mission to provide you with exceptional heart care, we have created designated Provider Care Teams.  These Care Teams include your primary Cardiologist (physician) and Advanced Practice Providers (APPs -  Physician Assistants and Nurse Practitioners) who all work together to provide you with the care you need, when you need it.  We recommend signing up for the patient portal called "MyChart".  Sign up information is provided on this After Visit Summary.  MyChart is used to connect with patients for Virtual Visits (Telemedicine).  Patients are able to view lab/test results, encounter notes, upcoming appointments, etc.  Non-urgent messages can be sent to your provider as well.   To learn more about what you can do with MyChart, go to [email or web address].    Your next appointment:   12 month(s)  Provider:   Dr. Radford Pax

## 2022-03-15 NOTE — Progress Notes (Signed)
Virtual Visit via Video Note   This visit type was conducted due to national recommendations for restrictions regarding the COVID-19 Pandemic (e.g. social distancing) in an effort to limit this patient's exposure and mitigate transmission in our community.  Due to his co-morbid illnesses, this patient is at least at moderate risk for complications without adequate follow up.  This format is felt to be most appropriate for this patient at this time.  All issues noted in this document were discussed and addressed.  A limited physical exam was performed with this format.  Please refer to the patient's chart for his consent to telehealth for Bradley Mcbride.     Date:  03/15/2022   ID:  Bradley Mcbride, DOB September 20, 1950, MRN [id number] The patient was identified using 2 identifiers.  Patient Location: Home Provider Location: Home Office   PCP:  Bradley Bien, MD   [facility] Providers Cardiologist:  Bradley Peru, MD     Evaluation Performed:  Follow-Up Visit  Chief Complaint:  OSA  History of Present Illness:    Bradley Mcbride is a 72 y.o. male with a hx of PAF who was referred for sleep study. He tells me that he really did not have any daytime sleepiness but admits to snoring.  He underwent home sleep study showing severe OSA with an AHI of 39.7/hr and was started on auto CPAP.   At last office visit in 2022 he was doing well with his CPAP device but had a large mask leak.  We got him an appointment with his DME to check his mask fit.  I have not seen him since then.  He is doing well with his PAP device but has not been using it all night.  He gets up to go to the bathroom and takes it off and then cannot get it back on.  He has Alzheimers which has progressed and is now even having problems feeding himself. He sleeps in a different room from his wife.  She helps him get it on each night when he goes to bed but then she sleeps in another room.    He tolerates the mask and  feels the pressure is adequate.  Since going on PAP he feels rested in the am .  He does get sleepy during the day but has not been napping.  He denies any significant mouth or nasal dryness or nasal congestion.  He does not think that he snores.    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 diff) (Dr. Marla Mcbride)   Tachycardia    Past Surgical History:  Procedure Laterality Date   CARDIOVERSION N/A 10/06/2017  Procedure: CARDIOVERSION;  Surgeon: Bradley Records, MD;  Location: [facility];  Service: Cardiovascular;  Laterality: N/A;   ORBITAL FRACTURE SURGERY     TEE WITHOUT CARDIOVERSION N/A 10/06/2017   Procedure: TRANSESOPHAGEAL ECHOCARDIOGRAM (TEE);  Surgeon: Bradley Records, MD;  Location: [facility];  Service: Cardiovascular;  Laterality: N/A;   UMBILICAL HERNIA REPAIR       No outpatient medications have been marked as taking for the 03/15/22 encounter (Video Visit) with Bradley Margarita, MD.     Allergies:   Atorvastatin   Social History   Tobacco Use   Smoking status: Every Day    Types: Cigarettes   Smokeless tobacco: Never   Tobacco comments:    5 cigarettes daily  Vaping Use   Vaping Use: Never used  Substance Use Topics   Alcohol use: Yes    Alcohol/week:  1.0 - 2.0 standard drink of alcohol    Types: 1 - 2 Shots of liquor per week    Comment: occasional   Drug use: Never     Family Hx: The patient's family history includes Heart disease in his mother.  ROS:   Please see the history of present illness.     All other systems reviewed and are negative.   Prior CV studies:   The following studies were reviewed today:  Home sleep study  Labs/Other Tests and Data Reviewed:    EKG:  No ECG reviewed.  Recent Labs: No results found for requested labs within last 365 days.   Recent Lipid Panel Lab Results  Component Value Date/Time   CHOL 126 05/25/2019 04:18 AM   TRIG 37 05/25/2019 04:18 AM   HDL 76 05/25/2019 04:18 AM   CHOLHDL 1.7 05/25/2019 04:18 AM   LDLCALC 43 05/25/2019 04:18 AM    Wt Readings from Last 3 Encounters:  01/19/21 165 lb (74.8 kg)  12/31/19 168 lb (76.2 kg)  07/03/19 166 lb (75.3 kg)     Risk Assessment/Calculations:    CHA2DS2-VASc Score =    This indicates a  % annual risk of stroke. The patient's score is based upon:         Objective:    Vital Signs:  BP 110/70   Pulse 75   Ht '5\' 9"'$  (1.753 m)   BMI 24.37 kg/m   Well nourished, well developed male in no acute distress. Well appearing, alert and conversant, regular work of breathing,  good skin color  Eyes- anicteric mouth- oral mucosa is pink  neuro- grossly intact skin- no apparent rash or lesions or cyanosis ASSESSMENT & PLAN:    OSA - The patient is tolerating PAP therapy well without any problems. The PAP download performed by his DME was personally reviewed and interpreted by me today and showed an AHI of 11.3/hr on auto CPAP from 4 to 20 cm H2O with 7% compliance in using more than 4 hours nightly.  The patient has been using and benefiting from PAP use and will continue to benefit from therapy.  -his download shows a significant mask leak but he just got a new headgear and is going to replace the cushion -I will get a download in  4 weeks -his usage is limited by his Alzheimers and getting the mask back on at night once he goes to the bathroom >>I encouraged him to leave his mask on and just unhook the tubing to try to improve compliance>>his wife will work with him -repeat a download in 4 weeks  Time:   Today, I have spent 15 minutes with the patient with telehealth technology discussing the above problems.     Medication Adjustments/Labs and Tests Ordered: Current medicines are reviewed at length with the patient today.  Concerns regarding medicines are outlined above.   Tests Ordered: No orders of the defined types were placed in this encounter.    Medication Changes: No orders of the defined types were placed in this encounter.    Follow Up:  In Person in 1 year(s)  Signed, Fransico Him, MD  03/15/2022 9:43 AM    [place]

## 2022-03-15 NOTE — Telephone Encounter (Signed)
  Patient Consent for Virtual Visit        Bradley Mcbride has provided verbal consent on 03/15/2022 for a virtual visit (video or telephone).   CONSENT FOR VIRTUAL VISIT FOR:  Bradley Mcbride  By participating in this virtual visit I agree to the following:  I hereby voluntarily request, consent and authorize [facility] and its employed or contracted physicians, physician assistants, nurse practitioners or other licensed health care professionals (the Practitioner), to provide me with telemedicine health care services (the "Services") as deemed necessary by the treating Practitioner. I acknowledge and consent to receive the Services by the Practitioner via telemedicine. I understand that the telemedicine visit will involve communicating with the Practitioner through live audiovisual communication technology and the disclosure of certain medical information by electronic transmission. I acknowledge that I have been given the opportunity to request an in-person assessment or other available alternative prior to the telemedicine visit and am voluntarily participating in the telemedicine visit.  I understand that I have the right to withhold or withdraw my consent to the use of telemedicine in the course of my care at any time, without affecting my right to future care or treatment, and that the Practitioner or I may terminate the telemedicine visit at any time. I understand that I have the right to inspect all information obtained and/or recorded in the course of the telemedicine visit and may receive copies of available information for a reasonable fee.  I understand that some of the potential risks of receiving the Services via telemedicine include:  Delay or interruption in medical evaluation due to technological equipment failure or disruption; Information transmitted may not be sufficient (e.g. poor resolution of images) to allow for appropriate medical decision making by the  Practitioner; and/or  In rare instances, security protocols could fail, causing a breach of personal health information.  Furthermore, I acknowledge that it is my responsibility to provide information about my medical history, conditions and care that is complete and accurate to the best of my ability. I acknowledge that Practitioner's advice, recommendations, and/or decision may be based on factors not within their control, such as incomplete or inaccurate data provided by me or distortions of diagnostic images or specimens that may result from electronic transmissions. I understand that the practice of medicine is not an exact science and that Practitioner makes no warranties or guarantees regarding treatment outcomes. I acknowledge that a copy of this consent can be made available to me via my patient portal ([facility]), or I can request a printed copy by calling the office of [facility].    I understand that my insurance will be billed for this visit.   I have read or had this consent read to me. I understand the contents of this consent, which adequately explains the benefits and risks of the Services being provided via telemedicine.  I have been provided ample opportunity to ask questions regarding this consent and the Services and have had my questions answered to my satisfaction. I give my informed consent for the services to be provided through the use of telemedicine in my medical care

## 2022-03-31 ENCOUNTER — Encounter (HOSPITAL_COMMUNITY): Payer: Self-pay | Admitting: *Deleted

## 2022-08-09 IMAGING — MR MR HEAD W/O CM
7 of 11 series · 25 of 48 positions shown · non-contrast
Comparison: None.

CLINICAL DATA: Expressive aphasia.

EXAM:
MRI HEAD WITHOUT CONTRAST
TECHNIQUE: Multiplanar, multiecho pulse sequences of the brain and surrounding
structures were obtained without intravenous contrast.

[Series 3: DWI · axial · 3.0mm · 0.94mm/px · z∈[-85,+73]mm · 7 of 110 slices shown (1 of 2)]
[im 1/110]
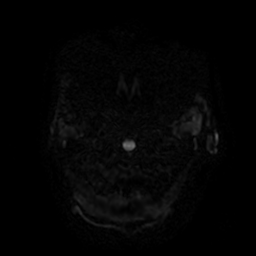
[im 19/110]
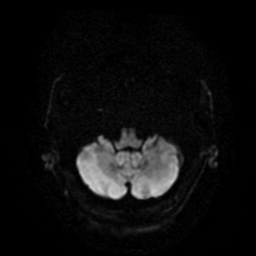
[im 37/110]
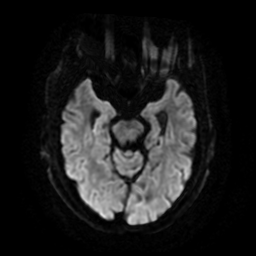
[im 55/110]
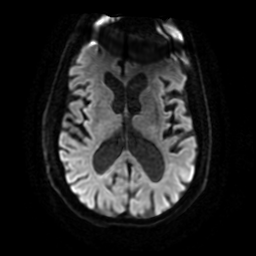
[im 73/110]
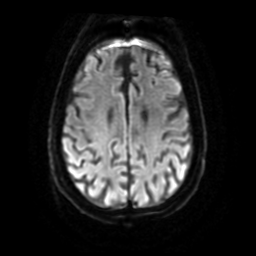
[im 91/110]
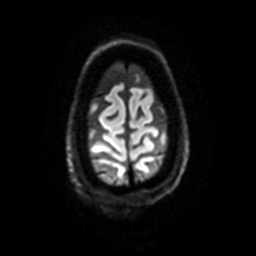
[im 110/110]
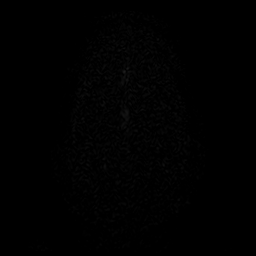

[Series 4: DWI · coronal · 4.0mm · 0.94mm/px · 6 of 82 slices shown (2 of 2)]
[im 1/82]
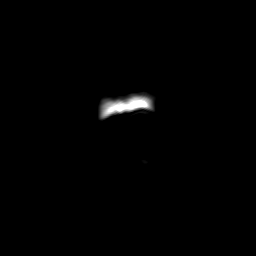
[im 17/82]
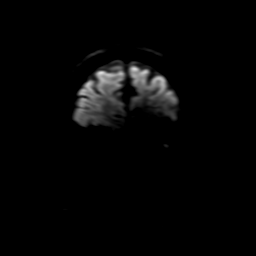
[im 33/82]
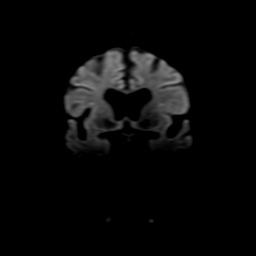
[im 49/82]
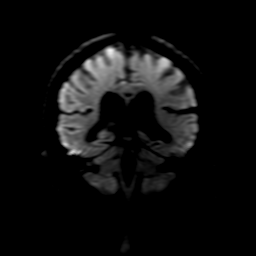
[im 65/82]
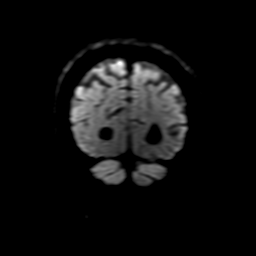
[im 82/82]
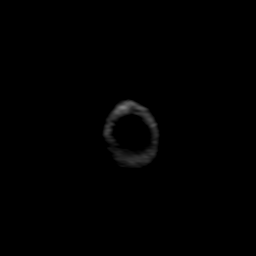

[Series 5: FLAIR · sagittal · 5.0mm · 0.23mm/px · 2 of 26 slices shown (1 of 2)]
[im 1/26]
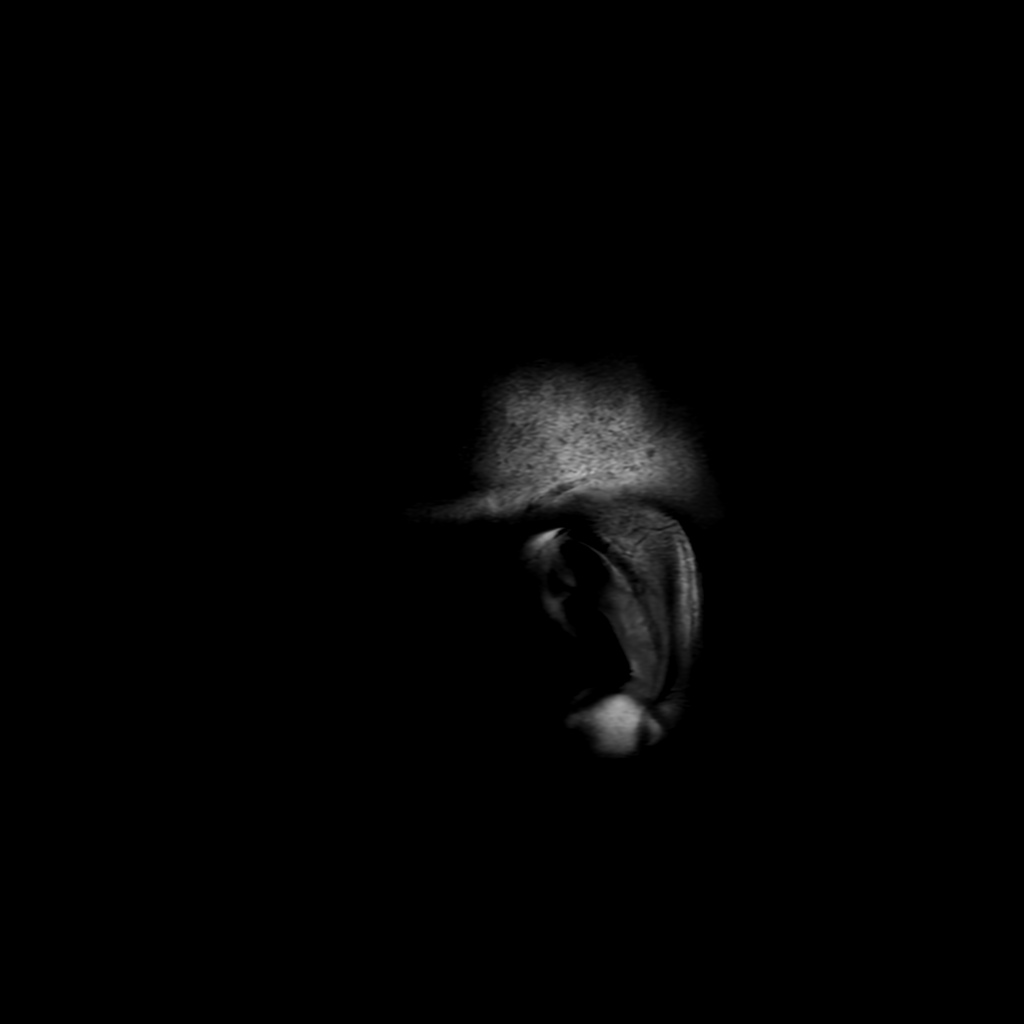
[im 26/26]
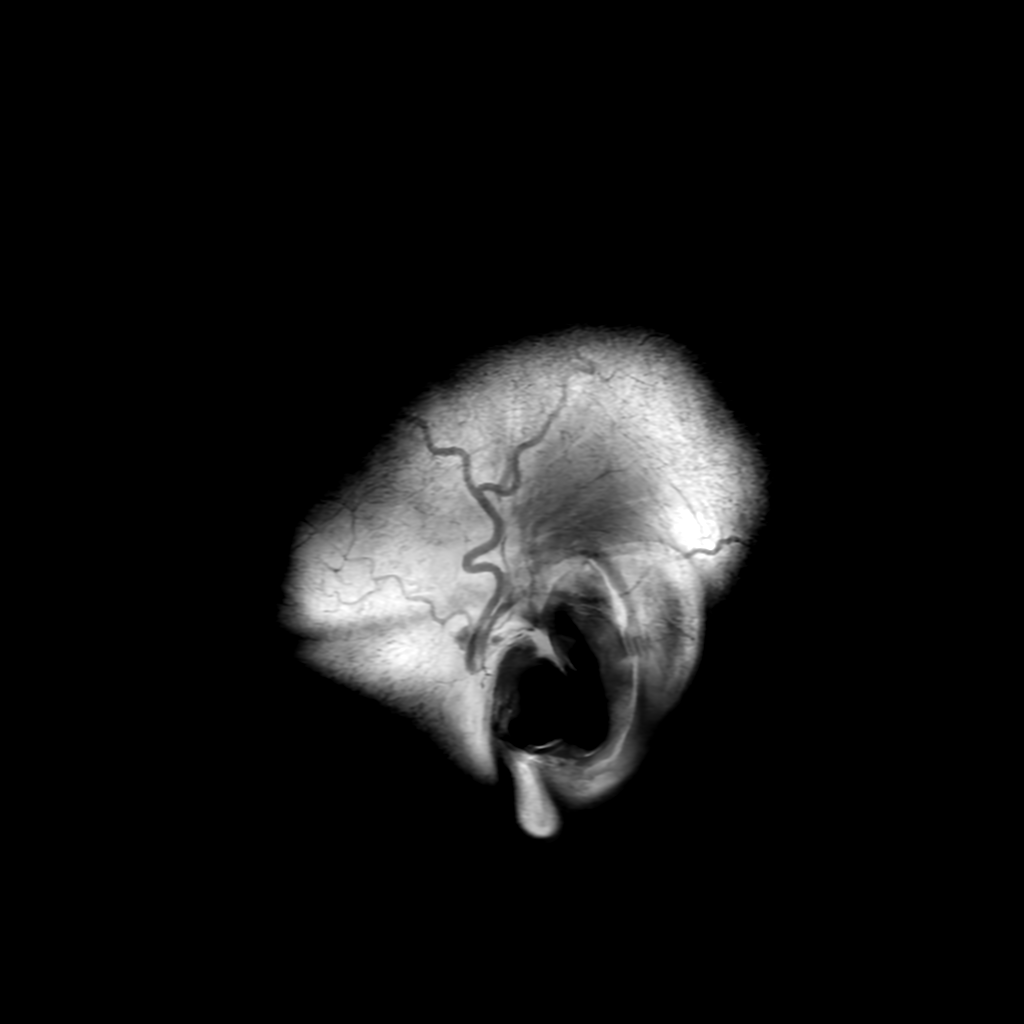

[Series 6: T2 · axial · 5.0mm · 0.23mm/px · 1 of 28 slices shown]
[im 1/28]
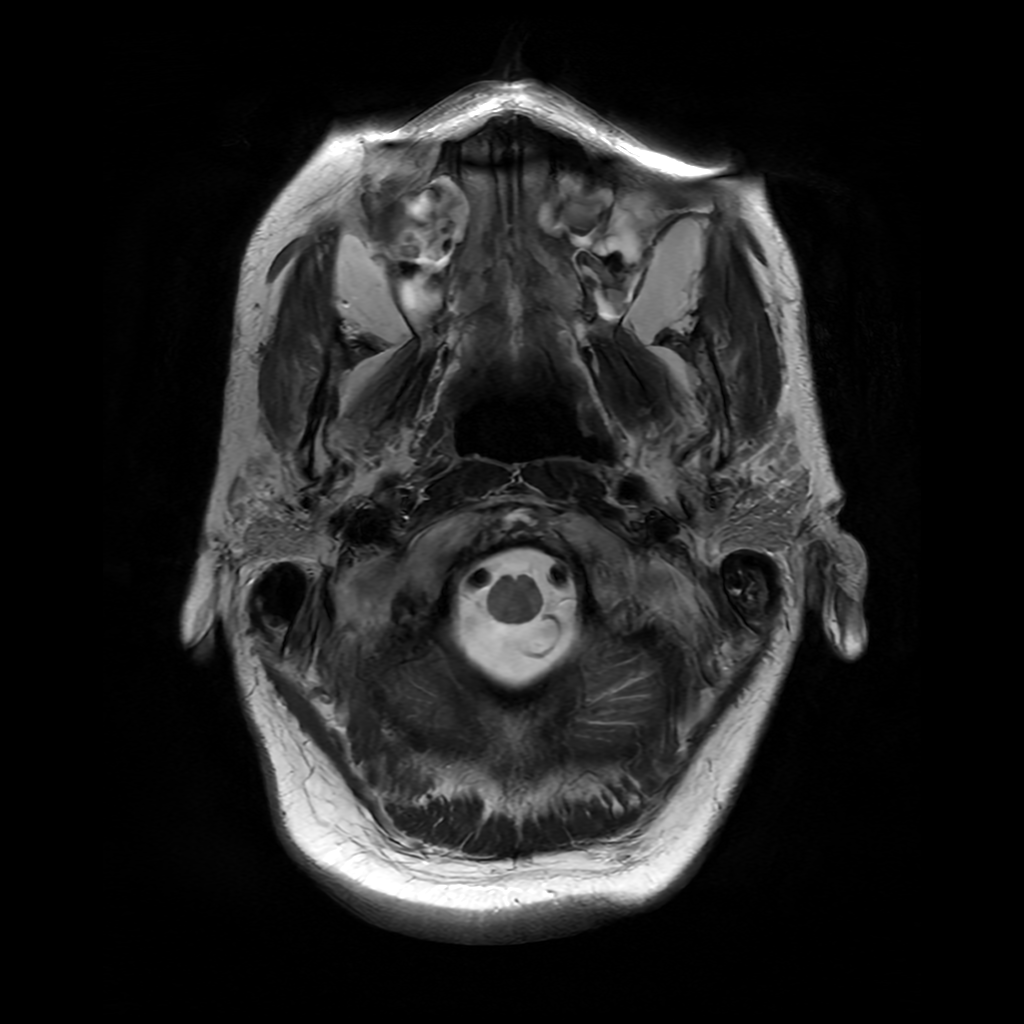

[Series 7: FLAIR · axial · 3.0mm · 0.45mm/px · z∈[-94,+60]mm · 2 of 28 slices shown (2 of 2)]
[im 1/28]
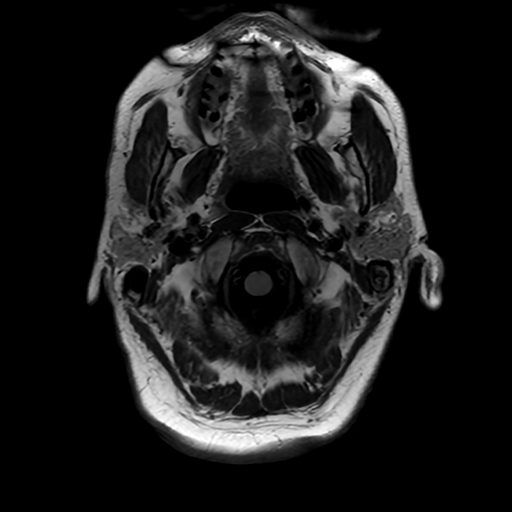
[im 28/28]
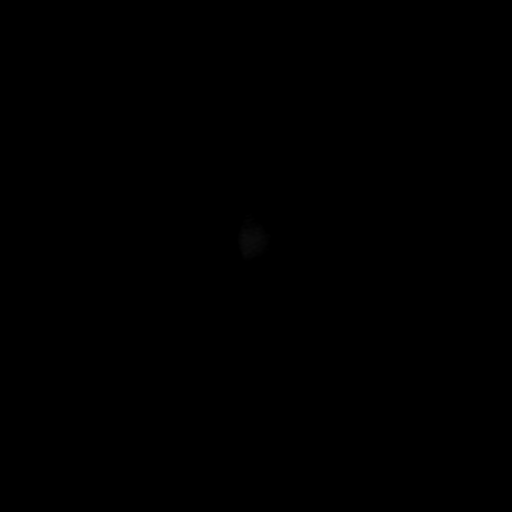

[Series 350: ADC · axial · 3.0mm · 0.94mm/px · z∈[-85,+73]mm · 4 of 55 slices shown (1 of 2)]
[im 1/55]
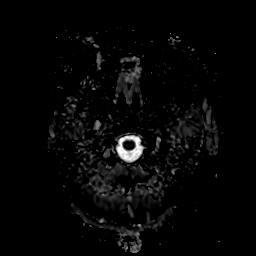
[im 19/55]
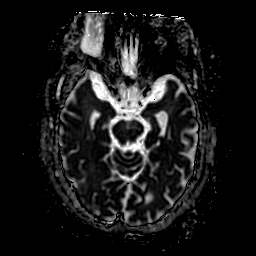
[im 37/55]
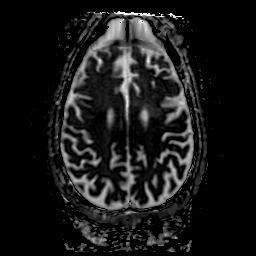
[im 55/55]
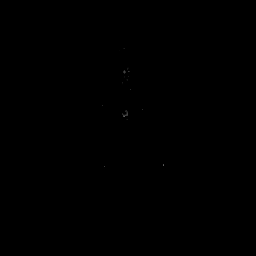

[Series 450: ADC · coronal · 4.0mm · 0.94mm/px · 3 of 41 slices shown (2 of 2)]
[im 1/41]
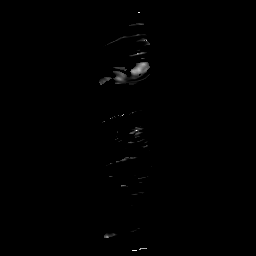
[im 21/41]
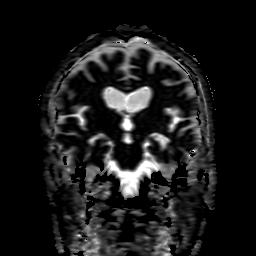
[im 41/41]
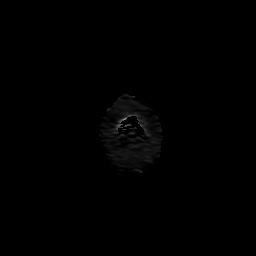

[25 of 48 positions shown; findings below may reference images not displayed]

FINDINGS: Brain: No acute infarction, hemorrhage, hydrocephalus, extra-axial
collection or mass lesion. Artifact obscures a portion of the
frontal lobes. On the susceptibility and diffusion imaging. Mild
scattered T2/FLAIR hyperintensities, likely the sequela of chronic
microvascular ischemic disease. Moderate diffuse cerebral atrophy
with ex vacuo ventricular dilation.

Vascular: Major proximal arterial flow voids are maintained at the
skull base.

Skull and upper cervical spine: Normal marrow signal.

Sinuses/Orbits: Mild scattered mucosal thickening without air-fluid
levels. Unremarkable orbits.

Other: None.
IMPRESSION: 1. No evidence of acute intracranial abnormality. Specifically, no
acute infarct.
2. Moderate generalized cerebral atrophy and mild chronic
microvascular ischemic disease.

## 2022-08-15 ENCOUNTER — Emergency Department (HOSPITAL_COMMUNITY): Payer: Medicare Other

## 2022-08-15 ENCOUNTER — Emergency Department (HOSPITAL_COMMUNITY)
Admission: EM | Admit: 2022-08-15 | Discharge: 2022-08-16 | Disposition: A | Discharge status: Home / Self Care | Payer: Medicare Other | Attending: Emergency Medicine | Admitting: Emergency Medicine

## 2022-08-15 ENCOUNTER — Encounter (HOSPITAL_COMMUNITY): Payer: Self-pay | Admitting: *Deleted

## 2022-08-15 ENCOUNTER — Other Ambulatory Visit: Payer: Self-pay

## 2022-08-15 DIAGNOSIS — D72829 Elevated white blood cell count, unspecified: Secondary | ICD-10-CM | POA: Insufficient documentation

## 2022-08-15 DIAGNOSIS — Y92003 Bedroom of unspecified non-institutional (private) residence as the place of occurrence of the external cause: Secondary | ICD-10-CM | POA: Diagnosis not present

## 2022-08-15 DIAGNOSIS — W19XXXA Unspecified fall, initial encounter: Secondary | ICD-10-CM

## 2022-08-15 DIAGNOSIS — R000 Tachycardia, unspecified: Secondary | ICD-10-CM | POA: Diagnosis not present

## 2022-08-15 DIAGNOSIS — W06XXXA Fall from bed, initial encounter: Secondary | ICD-10-CM | POA: Insufficient documentation

## 2022-08-15 DIAGNOSIS — S00211A Abrasion of right eyelid and periocular area, initial encounter: Secondary | ICD-10-CM | POA: Diagnosis not present

## 2022-08-15 DIAGNOSIS — Z7901 Long term (current) use of anticoagulants: Secondary | ICD-10-CM | POA: Diagnosis not present

## 2022-08-15 DIAGNOSIS — Z85828 Personal history of other malignant neoplasm of skin: Secondary | ICD-10-CM | POA: Insufficient documentation

## 2022-08-15 DIAGNOSIS — I480 Paroxysmal atrial fibrillation: Secondary | ICD-10-CM | POA: Diagnosis not present

## 2022-08-15 DIAGNOSIS — R7989 Other specified abnormal findings of blood chemistry: Secondary | ICD-10-CM | POA: Diagnosis not present

## 2022-08-15 DIAGNOSIS — F0390 Unspecified dementia without behavioral disturbance: Secondary | ICD-10-CM | POA: Diagnosis not present

## 2022-08-15 DIAGNOSIS — R008 Other abnormalities of heart beat: Secondary | ICD-10-CM | POA: Diagnosis not present

## 2022-08-15 DIAGNOSIS — M25511 Pain in right shoulder: Secondary | ICD-10-CM | POA: Insufficient documentation

## 2022-08-15 DIAGNOSIS — Z79899 Other long term (current) drug therapy: Secondary | ICD-10-CM | POA: Diagnosis not present

## 2022-08-15 DIAGNOSIS — S0081XA Abrasion of other part of head, initial encounter: Secondary | ICD-10-CM | POA: Diagnosis present

## 2022-08-15 HISTORY — DX: Dementia in other diseases classified elsewhere, unspecified severity, without behavioral disturbance, psychotic disturbance, mood disturbance, and anxiety: F02.80

## 2022-08-15 LAB — PROTIME-INR
INR: 1.4 — ABNORMAL HIGH (ref 0.8–1.2)
INR: 1.5 — ABNORMAL HIGH (ref 0.8–1.2)
Prothrombin Time: 17.6 seconds — ABNORMAL HIGH (ref 11.4–15.2)
Prothrombin Time: 18.1 seconds — ABNORMAL HIGH (ref 11.4–15.2)

## 2022-08-15 LAB — COMPREHENSIVE METABOLIC PANEL
ALT: 23 U/L (ref 0–44)
AST: 37 U/L (ref 15–41)
Albumin: 3.8 g/dL (ref 3.5–5.0)
Alkaline Phosphatase: 64 U/L (ref 38–126)
Anion gap: 10 (ref 5–15)
BUN: 18 mg/dL (ref 8–23)
CO2: 25 mmol/L (ref 22–32)
Calcium: 9.6 mg/dL (ref 8.9–10.3)
Chloride: 106 mmol/L (ref 98–111)
Creatinine, Ser: 1.44 mg/dL — ABNORMAL HIGH (ref 0.61–1.24)
GFR, Estimated: 52 mL/min — ABNORMAL LOW (ref >60)
Glucose, Bld: 134 mg/dL — ABNORMAL HIGH (ref 70–99)
Potassium: 4.4 mmol/L (ref 3.5–5.1)
Sodium: 141 mmol/L (ref 135–145)
Total Bilirubin: 1.1 mg/dL (ref 0.3–1.2)
Total Protein: 6.4 g/dL — ABNORMAL LOW (ref 6.5–8.1)

## 2022-08-15 LAB — I-STAT CHEM 8, ED
BUN: 20 mg/dL (ref 8–23)
Calcium, Ion: 1.25 mmol/L (ref 1.15–1.40)
Chloride: 107 mmol/L (ref 98–111)
Creatinine, Ser: 1.3 mg/dL — ABNORMAL HIGH (ref 0.61–1.24)
Glucose, Bld: 128 mg/dL — ABNORMAL HIGH (ref 70–99)
HCT: 45 % (ref 39.0–52.0)
Hemoglobin: 15.3 g/dL (ref 13.0–17.0)
Potassium: 4.3 mmol/L (ref 3.5–5.1)
Sodium: 143 mmol/L (ref 135–145)
TCO2: 26 mmol/L (ref 22–32)

## 2022-08-15 LAB — URINALYSIS, ROUTINE W REFLEX MICROSCOPIC
Bilirubin Urine: NEGATIVE
Glucose, UA: NEGATIVE mg/dL
Hgb urine dipstick: NEGATIVE
Ketones, ur: 20 mg/dL — AB
Leukocytes,Ua: NEGATIVE
Nitrite: NEGATIVE
Protein, ur: NEGATIVE mg/dL
Specific Gravity, Urine: 1.017 (ref 1.005–1.030)
pH: 5 (ref 5.0–8.0)

## 2022-08-15 LAB — CBC
HCT: 44.5 % (ref 39.0–52.0)
Hemoglobin: 14.9 g/dL (ref 13.0–17.0)
MCH: 31.5 pg (ref 26.0–34.0)
MCHC: 33.5 g/dL (ref 30.0–36.0)
MCV: 94.1 fL (ref 80.0–100.0)
Platelets: 267 10*3/uL (ref 150–400)
RBC: 4.73 MIL/uL (ref 4.22–5.81)
RDW: 12.6 % (ref 11.5–15.5)
WBC: 14.2 10*3/uL — ABNORMAL HIGH (ref 4.0–10.5)
nRBC: 0 % (ref 0.0–0.2)

## 2022-08-15 LAB — ETHANOL: Alcohol, Ethyl (B): <10 mg/dL (ref <10)

## 2022-08-15 LAB — LACTIC ACID, PLASMA
Lactic Acid, Venous: 3 mmol/L (ref 0.5–1.9)
Lactic Acid, Venous: 1.4 mmol/L (ref 0.5–1.9)

## 2022-08-15 LAB — SAMPLE TO BLOOD BANK

## 2022-08-15 MED ORDER — DILTIAZEM HCL 90 MG PO TABS
180.0000 mg | ORAL_TABLET | Freq: Once | ORAL | Status: AC
  Administered 2022-08-15: 180 mg via ORAL
  Filled 2022-08-15: qty 2

## 2022-08-15 MED ORDER — MEMANTINE HCL 10 MG PO TABS
10.0000 mg | ORAL_TABLET | Freq: Two times a day (BID) | ORAL | Status: DC
  Administered 2022-08-15 – 2022-08-16 (×2): 10 mg via ORAL
  Filled 2022-08-15 (×2): qty 1

## 2022-08-15 MED ORDER — DOCUSATE SODIUM 100 MG PO CAPS
100.0000 mg | ORAL_CAPSULE | Freq: Every day | ORAL | Status: DC | PRN
  Filled 2022-08-15: qty 1

## 2022-08-15 MED ORDER — QUETIAPINE FUMARATE 25 MG PO TABS
25.0000 mg | ORAL_TABLET | Freq: Two times a day (BID) | ORAL | Status: DC
  Administered 2022-08-15: 25 mg via ORAL
  Filled 2022-08-15: qty 1

## 2022-08-15 MED ORDER — APIXABAN 5 MG PO TABS
5.0000 mg | ORAL_TABLET | Freq: Two times a day (BID) | ORAL | Status: DC
  Administered 2022-08-15 – 2022-08-16 (×2): 5 mg via ORAL
  Filled 2022-08-15 (×2): qty 1

## 2022-08-15 MED ORDER — HALOPERIDOL 1 MG PO TABS
1.0000 mg | ORAL_TABLET | Freq: Every day | ORAL | Status: DC | PRN
  Administered 2022-08-15: 1 mg via ORAL
  Filled 2022-08-15: qty 1

## 2022-08-15 MED ORDER — SODIUM CHLORIDE 0.9 % IV BOLUS
1000.0000 mL | Freq: Once | INTRAVENOUS | Status: AC
  Administered 2022-08-15: 1000 mL via INTRAVENOUS

## 2022-08-15 NOTE — ED Notes (Addendum)
EDP and SW at [facility]

## 2022-08-15 NOTE — [place] FL2 (Signed)
[place] MEDICAID FL2 LEVEL OF CARE FORM     IDENTIFICATION  Patient Name: Bradley Mcbride Birthdate: [birth year] Sex: male Admission Date (Current Location): 08/15/2022  [facility] and [place] Number:  Producer, television/film/video and Address:  [facility], [address] [place], [place] [address]      Provider Number: [id number]  Attending Physician Name and Address:  Virgina Norfolk, DO  Relative Name and Phone Number:  Karras,BARBARA (Spouse)  [phone]    Current Level of Care: Hospital Recommended Level of Care: Skilled Nursing Facility Prior Approval Number:    Date Approved/Denied:   PASRR Number: [id number]  Discharge Plan: SNF    Current Diagnoses: Patient Active Problem List   Diagnosis Date Noted   OSA on CPAP 03/15/2022   Changing skin lesion 02/25/2020   Paroxysmal atrial fibrillation (HCC) 12/31/2019   Elevated bilirubin 05/26/2019   Chronic anticoagulation    Tachycardia    Atrial fibrillation/flutter (HCC) 05/24/2019   Atrial flutter (HCC) 10/05/2017    Orientation RESPIRATION BLADDER Height & Weight     Self  Normal Incontinent Weight: 165 lbs  Height:  5'9   BEHAVIORAL SYMPTOMS/MOOD NEUROLOGICAL BOWEL NUTRITION STATUS      Incontinent Diet (NPO)  AMBULATORY STATUS COMMUNICATION OF NEEDS Skin   Limited Assist Verbally Normal                       Personal Care Assistance Level of Assistance  Bathing, Feeding, Dressing Bathing Assistance: Maximum assistance Feeding assistance: Maximum assistance Dressing Assistance: Maximum assistance     Functional Limitations Info  Sight, Speech, Hearing Sight Info: Adequate Hearing Info: Adequate Speech Info: Impaired    SPECIAL CARE FACTORS FREQUENCY                       Contractures Contractures Info: Not present    Additional Factors Info  Allergies   Allergies Info: Atorvastatin           Current Medications (08/15/2022):  This is the current  hospital active medication list Current Facility-Administered Medications  Medication Dose Route Frequency Provider Last Rate Last Admin   apixaban (ELIQUIS) tablet 5 mg  5 mg Oral BID Curatolo, Adam, DO       haloperidol (HALDOL) tablet 1 mg  1 mg Oral Daily PRN Curatolo, Adam, DO       memantine (NAMENDA) tablet 10 mg  10 mg Oral BID Curatolo, Adam, DO       QUEtiapine (SEROQUEL) tablet 25 mg  25 mg Oral BID Curatolo, Adam, DO       Current Outpatient Medications  Medication Sig Dispense Refill   acetaminophen (TYLENOL) 500 MG tablet Take 500-1,000 mg by mouth every 6 (six) hours as needed for moderate pain or headache.     apixaban (ELIQUIS) 5 MG TABS tablet Take 1 tablet (5 mg total) by mouth 2 (two) times daily. 180 tablet 3   DILT-XR 180 MG 24 hr capsule Take 180 mg by mouth daily.     docusate sodium (COLACE) 100 MG capsule Take 100 mg by mouth daily as needed for mild constipation or moderate constipation.     haloperidol (HALDOL) 1 MG tablet Take 1 mg by mouth as needed for agitation.     memantine (NAMENDA) 10 MG tablet Take 10 mg by mouth 2 (two) times daily.     QUEtiapine (SEROQUEL) 25 MG tablet Take 25 mg by mouth 2 (two) times  daily.     ALPRAZolam (XANAX) 0.25 MG tablet SMARTSIG:1-2 Tablet(s) By Mouth 1-3 Times Daily PRN (Patient not taking: Reported on 08/15/2022)     pravastatin (PRAVACHOL) 40 MG tablet Take 1 tablet (40 mg total) by mouth every evening. (Patient not taking: Reported on 08/15/2022) 90 tablet 3   sildenafil (VIAGRA) 50 MG tablet Take 1 tablet (50 mg total) by mouth daily as needed for erectile dysfunction. (Patient not taking: Reported on 08/15/2022) 10 tablet 0     Discharge Medications: Please see discharge summary for a list of discharge medications.  Relevant Imaging Results:  Relevant Lab Results:   Additional Information SSN# [id number]  Susa Simmonds, LCSWA

## 2022-08-15 NOTE — ED Provider Notes (Signed)
[place] [facility]MENT AT [facility] Provider Note   CSN: [id number] Arrival date & time: 08/15/22  4098     History Chief Complaint  Patient presents with   Trauma   Fall    Bradley Mcbride is a 72 y.o. male with medical history of OSA on CPAP, tachycardia, chronic anticoagulation on Eliquis for paroxysmal atrial fibrillation, squamous cell carcinoma, aphasia, dementia.  The patient presents to the ED for evaluation of level 2 fall on thinners.  Patient reports that he fell out of bed this morning striking the right side of his body.  The patient wife was contacted for collateral information.  The patient wife reports the patient was found down after falling out of bed this morning and striking the right side of his face and his right shoulder.  Patient does have abrasion to the right side of his face.  The patient wife states that patient was on the ground for 5 to 10 minutes before being found by her.  EMS was called and the patient was helped up into his bed.  Patient does seem to have hit his head during fall so we will proceed with CT scans of head, cervical spine.  Will add on CT maxillofacial without contrast due to right-sided facial swelling as well as plain film imaging of right shoulder.  Patient wife reports the patient has paroxysmal A-fib and has not taken his diltiazem 180 mg today.  HPI     Home Medications Prior to Admission medications   Medication Sig Start Date End Date Taking? Authorizing Provider  acetaminophen (TYLENOL) 500 MG tablet Take 500-1,000 mg by mouth every 6 (six) hours as needed for moderate pain or headache.   Yes [provider]  apixaban (ELIQUIS) 5 MG TABS tablet Take 1 tablet (5 mg total) by mouth 2 (two) times daily. 05/26/19  Yes Duke, Roe Rutherford, PA  DILT-XR 180 MG 24 hr capsule Take 180 mg by mouth daily. 07/12/22  Yes [provider]  docusate sodium (COLACE) 100 MG capsule Take 100 mg by mouth daily as  needed for mild constipation or moderate constipation.   Yes [provider]  haloperidol (HALDOL) 1 MG tablet Take 1 mg by mouth as needed for agitation. 07/07/22  Yes [provider]  memantine (NAMENDA) 10 MG tablet Take 10 mg by mouth 2 (two) times daily.   Yes [provider]  QUEtiapine (SEROQUEL) 25 MG tablet Take 25 mg by mouth 2 (two) times daily. 07/27/22  Yes [provider]  ALPRAZolam Prudy Feeler) 0.25 MG tablet SMARTSIG:1-2 Tablet(s) By Mouth 1-3 Times Daily PRN Patient not taking: Reported on 08/15/2022    [provider]  pravastatin (PRAVACHOL) 40 MG tablet Take 1 tablet (40 mg total) by mouth every evening. Patient not taking: Reported on 08/15/2022 05/26/19 01/19/21  Marcelino Duster, PA  sildenafil (VIAGRA) 50 MG tablet Take 1 tablet (50 mg total) by mouth daily as needed for erectile dysfunction. Patient not taking: Reported on 08/15/2022 06/04/19   Newman Nip, NP      Allergies    Atorvastatin    Review of Systems   Review of Systems  Unable to perform ROS: Dementia (Level 5 caveat)  All other systems reviewed and are negative.   Physical Exam Updated Vital Signs BP 126/89   Pulse 96   Temp 97.8 F (36.6 C) (Oral)   Resp (!) 25   SpO2 100%  Physical Exam Vitals and nursing note reviewed.  Constitutional:      General: He is not in acute distress.    Appearance: He is not ill-appearing, toxic-appearing or diaphoretic.  HENT:     Head: Normocephalic.     Comments: Abrasion to right side periorbital region    Mouth/Throat:     Mouth: Mucous membranes are moist.     Pharynx: Oropharynx is clear.  Eyes:     Pupils: Pupils are equal, round, and reactive to light.     Comments: EOM intact nonpainful bilaterally  Neck:     Comments: No central cervical spinal tenderness  Cardiovascular:     Rate and Rhythm: Tachycardia present. Rhythm irregular.  Pulmonary:     Effort: Pulmonary effort is normal.     Breath sounds:  Normal breath sounds. No wheezing.  Abdominal:     General: Abdomen is flat.     Tenderness: There is no abdominal tenderness. There is no right CVA tenderness or left CVA tenderness.     Comments: All 4 quadrants of abdomen soft and compressible  Musculoskeletal:     Cervical back: Normal range of motion and neck supple. No tenderness.     Right lower leg: No edema.     Left lower leg: No edema.  Skin:    General: Skin is warm and dry.  Neurological:     Mental Status: He is alert. Mental status is at baseline.     Comments: Neurological examination at baseline     ED Results / Procedures / Treatments   Labs (all labs ordered are listed, but only abnormal results are displayed) Labs Reviewed  COMPREHENSIVE METABOLIC PANEL - Abnormal; Notable for the following components:      Result Value   Glucose, Bld 134 (*)    Creatinine, Ser 1.44 (*)    Total Protein 6.4 (*)    GFR, Estimated 52 (*)    All other components within normal limits  CBC - Abnormal; Notable for the following components:   WBC 14.2 (*)    All other components within normal limits  LACTIC ACID, PLASMA - Abnormal; Notable for the following components:   Lactic Acid, Venous 3.0 (*)    All other components within normal limits  PROTIME-INR - Abnormal; Notable for the following components:   Prothrombin Time 17.6 (*)    INR 1.4 (*)    All other components within normal limits  PROTIME-INR - Abnormal; Notable for the following components:   Prothrombin Time 18.1 (*)    INR 1.5 (*)    All other components within normal limits  I-STAT CHEM 8, ED - Abnormal; Notable for the following components:   Creatinine, Ser 1.30 (*)    Glucose, Bld 128 (*)    All other components within normal limits  ETHANOL  URINALYSIS, ROUTINE W REFLEX MICROSCOPIC  LACTIC ACID, PLASMA  LACTIC ACID, PLASMA  SAMPLE TO BLOOD BANK    EKG EKG Interpretation  Date/Time:  Monday August 15 2022 09:55:20 EDT Ventricular Rate:  119 PR  Interval:    QRS Duration: 103 QT Interval:  367 QTC Calculation: 488 R Axis:   103 Text Interpretation: Atrial fibrillation Consider right ventricular hypertrophy Confirmed by Virgina Norfolk ([id number]) on 08/15/2022 9:57:15 AM  Radiology DG Pelvis Portable  Result Date: 08/15/2022 CLINICAL DATA:  Fall last night.  Pain in the hips and pelvis. EXAM: PORTABLE PELVIS 1-2 VIEWS COMPARISON:  None Available. FINDINGS: 1330 hours. Single AP view of the pelvis is mildly rotated to the right. The mineralization  and alignment are normal. There is no evidence of acute fracture or dislocation. No evidence of femoral head osteonecrosis. There are mild degenerative changes at both hips. More advanced degenerative changes are seen in the visualized lower lumbar spine. The soft tissues appear unremarkable. IMPRESSION: No evidence of acute pelvic fracture or dislocation. Lower lumbar spondylosis. Dedicated hip radiographs should be considered if the patient has focal hip pain and/or inability to bear weight. Electronically Signed   By: Carey Bullocks M.D.   On: 08/15/2022 13:43   CT CERVICAL SPINE WO CONTRAST  Result Date: 08/15/2022 CLINICAL DATA:  Provided history: Head trauma, moderate/severe. Polytrauma, blunt. Facial trauma, blunt. EXAM: CT HEAD WITHOUT CONTRAST CT MAXILLOFACIAL WITHOUT CONTRAST CT CERVICAL SPINE WITHOUT CONTRAST TECHNIQUE: Multidetector CT imaging of the head, cervical spine, and maxillofacial structures were performed using the standard protocol without intravenous contrast. Multiplanar CT image reconstructions of the cervical spine and maxillofacial structures were also generated. RADIATION DOSE REDUCTION: This exam was performed according to the departmental dose-optimization program which includes automated exposure control, adjustment of the mA and/or kV according to patient size and/or use of iterative reconstruction technique. COMPARISON:  Brain MRI 12/13/2019. FINDINGS: CT HEAD FINDINGS  Brain: Moderate generalized cerebral atrophy. Commensurate prominence of the ventricles and sulci. Patchy and ill-defined hypoattenuation within the cerebral white matter, nonspecific but compatible with mild chronic small vessel ischemic disease. There is no acute intracranial hemorrhage. No demarcated cortical infarct. No extra-axial fluid collection. No evidence of an intracranial mass. No midline shift. Vascular: No hyperdense vessel.  Atherosclerotic calcifications. Skull: No fracture or aggressive osseous lesion. CT MAXILLOFACIAL FINDINGS Osseous: No acute maxillofacial fracture is identified. Orbits: No acute orbital finding. Sinuses: Minimal mucosal thickening within the right frontal, bilateral ethmoid, bilateral sphenoid and bilateral maxillary sinuses. Soft tissues: No significant maxillofacial hematoma is appreciable by CT. CT CERVICAL SPINE FINDINGS Alignment: No significant spondylolisthesis. Skull base and vertebrae: The basion-dental and atlanto-dental intervals are maintained.No evidence of acute fracture to the cervical spine. At C3-C4, there is bilateral facet ankylosis and suspected early osseous fusion across the disc space. At C5-C6, there is facet ankylosis on the left, suspected early osseous fusion across the disc space and a bridging ventral osteophyte. Ventral osteophytes also present at C4-C5, C6-C7, C7, T1, T1-T2 and T3-T4. Soft tissues and spinal canal: No prevertebral fluid or swelling. No visible canal hematoma. Disc levels: Cervical spondylosis with multilevel disc space narrowing, disc bulges, uncovertebral hypertrophy and facet arthrosis. No appreciable high-grade spinal canal stenosis. Bony neural foraminal narrowing on the left at C4-C5 and C6-C7. Upper chest: No consolidation within the imaged lung apices. No visible pneumothorax. IMPRESSION: CT head: 1.  No evidence of an acute intracranial abnormality. 2. Parenchymal atrophy and chronic small vessel ischemic disease, as  described. CT maxillofacial: 1. No evidence of acute maxillofacial fracture. 2. Minimal paranasal sinus mucosal thickening. CT cervical spine: 1. No evidence of an acute cervical spine fracture. 2. Cervical spondylosis as described. 3. C3-C4 and C5-C6 vertebral ankylosis. Electronically Signed   By: Jackey Loge D.O.   On: 08/15/2022 11:43   CT HEAD WO CONTRAST  Result Date: 08/15/2022 CLINICAL DATA:  Provided history: Head trauma, moderate/severe. Polytrauma, blunt. Facial trauma, blunt. EXAM: CT HEAD WITHOUT CONTRAST CT MAXILLOFACIAL WITHOUT CONTRAST CT CERVICAL SPINE WITHOUT CONTRAST TECHNIQUE: Multidetector CT imaging of the head, cervical spine, and maxillofacial structures were performed using the standard protocol without intravenous contrast. Multiplanar CT image reconstructions of the cervical spine and maxillofacial structures were also generated. RADIATION DOSE  REDUCTION: This exam was performed according to the departmental dose-optimization program which includes automated exposure control, adjustment of the mA and/or kV according to patient size and/or use of iterative reconstruction technique. COMPARISON:  Brain MRI 12/13/2019. FINDINGS: CT HEAD FINDINGS Brain: Moderate generalized cerebral atrophy. Commensurate prominence of the ventricles and sulci. Patchy and ill-defined hypoattenuation within the cerebral white matter, nonspecific but compatible with mild chronic small vessel ischemic disease. There is no acute intracranial hemorrhage. No demarcated cortical infarct. No extra-axial fluid collection. No evidence of an intracranial mass. No midline shift. Vascular: No hyperdense vessel.  Atherosclerotic calcifications. Skull: No fracture or aggressive osseous lesion. CT MAXILLOFACIAL FINDINGS Osseous: No acute maxillofacial fracture is identified. Orbits: No acute orbital finding. Sinuses: Minimal mucosal thickening within the right frontal, bilateral ethmoid, bilateral sphenoid and bilateral  maxillary sinuses. Soft tissues: No significant maxillofacial hematoma is appreciable by CT. CT CERVICAL SPINE FINDINGS Alignment: No significant spondylolisthesis. Skull base and vertebrae: The basion-dental and atlanto-dental intervals are maintained.No evidence of acute fracture to the cervical spine. At C3-C4, there is bilateral facet ankylosis and suspected early osseous fusion across the disc space. At C5-C6, there is facet ankylosis on the left, suspected early osseous fusion across the disc space and a bridging ventral osteophyte. Ventral osteophytes also present at C4-C5, C6-C7, C7, T1, T1-T2 and T3-T4. Soft tissues and spinal canal: No prevertebral fluid or swelling. No visible canal hematoma. Disc levels: Cervical spondylosis with multilevel disc space narrowing, disc bulges, uncovertebral hypertrophy and facet arthrosis. No appreciable high-grade spinal canal stenosis. Bony neural foraminal narrowing on the left at C4-C5 and C6-C7. Upper chest: No consolidation within the imaged lung apices. No visible pneumothorax. IMPRESSION: CT head: 1.  No evidence of an acute intracranial abnormality. 2. Parenchymal atrophy and chronic small vessel ischemic disease, as described. CT maxillofacial: 1. No evidence of acute maxillofacial fracture. 2. Minimal paranasal sinus mucosal thickening. CT cervical spine: 1. No evidence of an acute cervical spine fracture. 2. Cervical spondylosis as described. 3. C3-C4 and C5-C6 vertebral ankylosis. Electronically Signed   By: Jackey Loge D.O.   On: 08/15/2022 11:43   CT Maxillofacial Wo Contrast  Result Date: 08/15/2022 CLINICAL DATA:  Provided history: Head trauma, moderate/severe. Polytrauma, blunt. Facial trauma, blunt. EXAM: CT HEAD WITHOUT CONTRAST CT MAXILLOFACIAL WITHOUT CONTRAST CT CERVICAL SPINE WITHOUT CONTRAST TECHNIQUE: Multidetector CT imaging of the head, cervical spine, and maxillofacial structures were performed using the standard protocol without  intravenous contrast. Multiplanar CT image reconstructions of the cervical spine and maxillofacial structures were also generated. RADIATION DOSE REDUCTION: This exam was performed according to the departmental dose-optimization program which includes automated exposure control, adjustment of the mA and/or kV according to patient size and/or use of iterative reconstruction technique. COMPARISON:  Brain MRI 12/13/2019. FINDINGS: CT HEAD FINDINGS Brain: Moderate generalized cerebral atrophy. Commensurate prominence of the ventricles and sulci. Patchy and ill-defined hypoattenuation within the cerebral white matter, nonspecific but compatible with mild chronic small vessel ischemic disease. There is no acute intracranial hemorrhage. No demarcated cortical infarct. No extra-axial fluid collection. No evidence of an intracranial mass. No midline shift. Vascular: No hyperdense vessel.  Atherosclerotic calcifications. Skull: No fracture or aggressive osseous lesion. CT MAXILLOFACIAL FINDINGS Osseous: No acute maxillofacial fracture is identified. Orbits: No acute orbital finding. Sinuses: Minimal mucosal thickening within the right frontal, bilateral ethmoid, bilateral sphenoid and bilateral maxillary sinuses. Soft tissues: No significant maxillofacial hematoma is appreciable by CT. CT CERVICAL SPINE FINDINGS Alignment: No significant spondylolisthesis. Skull base and vertebrae: The  basion-dental and atlanto-dental intervals are maintained.No evidence of acute fracture to the cervical spine. At C3-C4, there is bilateral facet ankylosis and suspected early osseous fusion across the disc space. At C5-C6, there is facet ankylosis on the left, suspected early osseous fusion across the disc space and a bridging ventral osteophyte. Ventral osteophytes also present at C4-C5, C6-C7, C7, T1, T1-T2 and T3-T4. Soft tissues and spinal canal: No prevertebral fluid or swelling. No visible canal hematoma. Disc levels: Cervical  spondylosis with multilevel disc space narrowing, disc bulges, uncovertebral hypertrophy and facet arthrosis. No appreciable high-grade spinal canal stenosis. Bony neural foraminal narrowing on the left at C4-C5 and C6-C7. Upper chest: No consolidation within the imaged lung apices. No visible pneumothorax. IMPRESSION: CT head: 1.  No evidence of an acute intracranial abnormality. 2. Parenchymal atrophy and chronic small vessel ischemic disease, as described. CT maxillofacial: 1. No evidence of acute maxillofacial fracture. 2. Minimal paranasal sinus mucosal thickening. CT cervical spine: 1. No evidence of an acute cervical spine fracture. 2. Cervical spondylosis as described. 3. C3-C4 and C5-C6 vertebral ankylosis. Electronically Signed   By: Jackey Loge D.O.   On: 08/15/2022 11:43   DG Shoulder Right  Result Date: 08/15/2022 CLINICAL DATA:  Status post fall.  Pain. EXAM: RIGHT SHOULDER - 3 VIEW COMPARISON:  None Available. FINDINGS: Slight osteophyte formation along the [facility] joint and glenohumeral joint. No fracture or dislocation. Preserved bone mineralization. Overlapping cardiac leads. Prominent degenerative changes of the cervical spine at the edge of the imaging field. IMPRESSION: Mild degenerative changes of the shoulder. Electronically Signed   By: Karen Kays M.D.   On: 08/15/2022 10:46   DG Chest Port 1 View  Result Date: 08/15/2022 CLINICAL DATA:  Trauma EXAM: PORTABLE CHEST 1 VIEW COMPARISON:  05/24/2019 FINDINGS: The heart size and mediastinal contours are within normal limits. Both lungs are clear. The visualized skeletal structures are unremarkable. IMPRESSION: No active disease. Electronically Signed   By: Ernie Avena M.D.   On: 08/15/2022 10:24    Procedures Procedures   Medications Ordered in ED Medications  apixaban (ELIQUIS) tablet 5 mg (has no administration in time range)  haloperidol (HALDOL) tablet 1 mg (has no administration in time range)  memantine (NAMENDA)  tablet 10 mg (has no administration in time range)  QUEtiapine (SEROQUEL) tablet 25 mg (has no administration in time range)  diltiazem (CARDIZEM) tablet 180 mg (180 mg Oral Given 08/15/22 1145)  sodium chloride 0.9 % bolus 1,000 mL (0 mLs Intravenous Stopped 08/15/22 1327)    ED Course/ Medical Decision Making/ A&P Clinical Course as of 08/15/22 1523  Mon Aug 15, 2022  1016 Dilt 180mg  [CG]    Clinical Course User Index [CG] Al Decant, PA-C   Medical Decision Making Amount and/or Complexity of Data Reviewed Labs: ordered. Radiology: ordered.   72 year old male presents to the ED for evaluation.  Please see HPI for further details.  On my examination the patient is afebrile, tachycardic with an irregular heart rhythm representing A-fib on the monitor.  Lung sounds clear to auscultation bilaterally, not hypoxic.  Abdomen soft and compressible.  No centralized cervical spinal tenderness, no centralized lumbar spinal tenderness, no centralized thoracic spinal tenderness.  Patient neurological examination at baseline per patient wife.  Patient has no pain on exam.  Patient neurological examination at baseline.  Patient came in as a level 2 fall on thinners so trauma labs were collected.  Will also image patient utilizing CT cervical spine, CT head, CT maxillofacial.  Plain film imaging will include chest x-ray, right shoulder x-ray, portable pelvis x-ray.  CBC with leukocytosis to 14.2, no anemia.  Patient CMP shows an elevated creatinine of 1.44, anion gap 10.  Last creatinine collected on this patient was 3 years ago which was 1.10.  PT/INR elevated in the setting of anticoagulation.  Urinalysis pending this time.  Initial lactic acid 3.0.  Discussed with attending who feels this is nonspecific.  Patient afebrile and nontachycardic, denies pain.  Patient given 1 L fluid.  Patient also given 180 mg diltiazem for rate control, this is his home dosage of medication that he has not  taken yet.  At this time, the patient wife reports that she does not feel comfortable taking the patient home.  Discussed with attending who feels as if the patient will need physical therapy and Occupational Therapy evaluation.  This order has been placed.  Patient case discussed with social worker will attempt to find patient placement.  Patient disposition pending PT OT evaluation. Patient also will need urine sample collected.   Final Clinical Impression(s) / ED Diagnoses Final diagnoses:  Fall, initial encounter    Rx / DC Orders ED Discharge Orders     None         Al Decant, PA-C 08/15/22 1524    Virgina Norfolk, DO 08/15/22 1554

## 2022-08-15 NOTE — ED Notes (Signed)
Pt in CT, not in room.  

## 2022-08-15 NOTE — ED Notes (Signed)
Got patient on the monitor got patient some warm blankets did EKG shown to Dr Scharlene Gloss patient is resting with nurse at bedside

## 2022-08-15 NOTE — ED Notes (Signed)
Xray at [facility] for port. pelvis

## 2022-08-15 NOTE — Progress Notes (Signed)
   08/15/22 1015  Spiritual Encounters  Type of Visit Initial  Care provided to: Patient  Conversation partners present during encounter Nurse  Referral source Trauma page  Reason for visit Trauma  OnCall Visit No   Chaplain responding to Trauma 2 Fall on thinners call.  PT received in room and EMS informed us of advanced dementia HX, aqnd mentioned that wife was on her way.  PT was alert but unable to tell me hjis wife's name.  Chaplain left off trying to speak with PT as medical team provided care.  Chaplain went to waiting area attempting to locate wife.  She had not yet arrived.  Chaplain directed security to send her right back when she arrived. Chaplain services remain available by paging Spiritual Care.

## 2022-08-15 NOTE — ED Triage Notes (Signed)
BIB [facility] from home s/p fall in the middle of the night inside, hit R temple, abrasion/redness present, no active bleeding or bruising. H/o Alzheimers's dementia, mentation at baseline per wife at home. Takes eliquis. C/o pain R arm/ shoulder. FD placed t-shirt sling. Level 2 trauma activated PTA for FOT. No IV established PTA.Arrives alert, NAD, calm, interactive, no respiratory distress, resps e/u. VSS for EMS136/82, HR 62,RR 16, SPO2 99% RA. BS 143.

## 2022-08-15 NOTE — Evaluation (Signed)
Physical Therapy Evaluation Patient Details Name: Bradley Mcbride MRN: [id number] DOB: [birth year] Today's Date: 08/15/2022  History of Present Illness  72 y.o. male presents to [facility] hospital on 08/15/2022 after rolling out of bed into the floor. PMH includes OSA, afib, squamous cell carcinoma, aphasia, dementia.  Clinical Impression  Pt presents to PT with deficits in cognition, communication, functional mobility, gait, balance, endurance, strength, power. Pt is lethargic for eval, recently received haldol prior to session. Pt presents with a R lateral and posterior lean in sitting and standing, requiring physical assistance to perform all functional mobility tasks. Pt was recently able to ambulate in the neighborhood with his spouse, with a significant decline in function over the last week. Pt will benefit from continued acute PT services in an effort to improve balance and reduce falls risk. Due to acute decline PT recommends short term inpatient PT services at the time of discharge.       Recommendations for follow up therapy are one component of a multi-disciplinary discharge planning process, led by the attending physician.  Recommendations may be updated based on patient status, additional functional criteria and insurance authorization.  Follow Up Recommendations Can patient physically be transported by private vehicle: No     Assistance Recommended at Discharge Frequent or constant Supervision/Assistance  Patient can return home with the following  A lot of help with walking and/or transfers;A lot of help with bathing/dressing/bathroom;Assistance with cooking/housework;Direct supervision/assist for medications management;Direct supervision/assist for financial management;Assist for transportation;Help with stairs or ramp for entrance    Equipment Recommendations None recommended by PT  Recommendations for Other Services       Functional Status Assessment Patient has had a recent  decline in their functional status and demonstrates the ability to make significant improvements in function in a reasonable and predictable amount of time.     Precautions / Restrictions Precautions Precautions: Fall Restrictions Weight Bearing Restrictions: No      Mobility  Bed Mobility Overal bed mobility: Needs Assistance Bed Mobility: Supine to Sit, Sit to Supine     Supine to sit: Max assist, HOB elevated Sit to supine: Max assist        Transfers Overall transfer level: Needs assistance Equipment used: 1 person hand held assist Transfers: Sit to/from Stand Sit to Stand: Mod assist           General transfer comment: posterior and R lean, tactile and verbal cues to facilitate initiation of stand    Ambulation/Gait Ambulation/Gait assistance:  (deferred due to impaired static standing balance)                Stairs            Wheelchair Mobility    Modified Rankin (Stroke Patients Only)       Balance Overall balance assessment: Needs assistance Sitting-balance support: Single extremity supported, Feet supported Sitting balance-Leahy Scale: Poor Sitting balance - Comments: right lateral lean Postural control: Right lateral lean Standing balance support: Single extremity supported Standing balance-Leahy Scale: Poor Standing balance comment: modA, posterior and R lean                             Pertinent Vitals/Pain Pain Assessment Pain Assessment: PAINAD Breathing: normal Negative Vocalization: none Facial Expression: smiling or inexpressive Body Language: relaxed Consolability: no need to console PAINAD Score: 0    Home Living Family/patient expects to be discharged to:: Private residence Living Arrangements: Spouse/significant other Available  Help at Discharge: Family;Personal care attendant;Available 24 hours/day (aide 3 days a week for 4 hours each day) Type of Home: House Home Access: Stairs to enter Entrance  Stairs-Rails: None Entrance Stairs-Number of Steps: 2   Home Layout: Able to live on main level with bedroom/bathroom Home Equipment: Agricultural consultant (2 wheels);Rollator (4 wheels);BSC/3in1;Shower seat;Wheelchair - manual      Prior Function Prior Level of Function : Needs assist             Mobility Comments: PRN assist for transfers, ambulates with minG. Typically able to ambulate in neighborhood with spouse until decline this last week ADLs Comments: assistance for ADLs and all IADLs     Hand Dominance        Extremity/Trunk Assessment   Upper Extremity Assessment Upper Extremity Assessment: Generalized weakness;Difficult to assess due to impaired cognition (recently given haldol, spouse reports R sided weakness at baseline)    Lower Extremity Assessment Lower Extremity Assessment: Generalized weakness;Difficult to assess due to impaired cognition (spouse reports R sided weakness at baseline)    Cervical / Trunk Assessment Cervical / Trunk Assessment: Kyphotic  Communication   Communication: Expressive difficulties;Receptive difficulties (chronic expressive aphasia)  Cognition Arousal/Alertness: Lethargic, Suspect due to medications (haldol at 3:30PM) Behavior During Therapy: Flat affect Overall Cognitive Status: History of cognitive impairments - at baseline                                 General Comments: pt is oriented to self, identifies his spouse. Pt with significant expressive aphasia. Baseline dementia.        General Comments General comments (skin integrity, edema, etc.): VSS on RA    Exercises     Assessment/Plan    PT Assessment Patient needs continued PT services  PT Problem List Decreased strength;Decreased balance;Decreased activity tolerance;Decreased mobility;Decreased cognition;Decreased knowledge of use of DME;Decreased safety awareness;Decreased knowledge of precautions       PT Treatment Interventions DME  instruction;Functional mobility training;Therapeutic exercise;Therapeutic activities;Balance training;Neuromuscular re-education;Gait training;Stair training;Patient/family education;Cognitive remediation    PT Goals (Current goals can be found in the Care Plan section)  Acute Rehab PT Goals Patient Stated Goal: to improve balance and gait quality, reduce falls risk PT Goal Formulation: With patient Time For Goal Achievement: 08/29/22 Potential to Achieve Goals: Fair    Frequency Min 3X/week     Co-evaluation               AM-PAC PT "6 Clicks" Mobility  Outcome Measure Help needed turning from your back to your side while in a flat bed without using bedrails?: A Lot Help needed moving from lying on your back to sitting on the side of a flat bed without using bedrails?: A Lot Help needed moving to and from a bed to a chair (including a wheelchair)?: A Lot Help needed standing up from a chair using your arms (e.g., wheelchair or bedside chair)?: A Lot Help needed to walk in hospital room?: Total Help needed climbing 3-5 steps with a railing? : Total 6 Click Score: 10    End of Session   Activity Tolerance: Patient limited by lethargy Patient left: in bed;with family/visitor present Nurse Communication: Mobility status PT Visit Diagnosis: Other abnormalities of gait and mobility (R26.89);Muscle weakness (generalized) (M62.81);Other symptoms and signs involving the nervous system (R29.898)    Time: 4540-9811 PT Time Calculation (min) (ACUTE ONLY): 21 min   Charges:   PT Evaluation $PT  Eval Low Complexity: 1 Low          Arlyss Gandy, PT, DPT Acute Rehabilitation Office [phone]   Arlyss Gandy 08/15/2022, 4:41 PM

## 2022-08-15 NOTE — ED Notes (Signed)
ED PA at BS 

## 2022-08-15 NOTE — ED Notes (Signed)
Xray back at [facility] for shoulder film

## 2022-08-15 NOTE — ED Notes (Signed)
Called staffing for a sitter or extra tech to help d/t pt is max assist and [facility] does not have a tech to assist this RN w/ pt care. CN aware. Staffing reports they don't have anyone right now, but should at 1900.

## 2022-08-15 NOTE — Progress Notes (Signed)
TOC CSW faxed pt out for bed offers.  Ariyan Brisendine Tarpley-Carter, MSW, LCSW-A Pronouns:  She/Her/Hers [facility] Clinical Social Worker Direct Number:  [phone] [email or web address]  

## 2022-08-15 NOTE — ED Notes (Signed)
Pt alert, NAD, calm, interactive, resps e/u, speaking clearly, interactive. Wife present.

## 2022-08-15 NOTE — TOC Initial Note (Addendum)
Transition of Care [facility]) - Initial/Assessment Note    Patient Details  Name: Bradley Mcbride MRN: [id number] Date of Birth: 04/25/1950  Transition [facility]) CM/SW Contact:    Susa Simmonds, LCSWA Phone Number: 08/15/2022, 2:54 PM  Clinical Narrative: CSW spoke with patients wife at bedside. Patient is only oriented to self. Patients wife stated she is interested in rehab if its recommended. Patient currently has home health services with [facility] health and hospice. CSW explained Medicare A/B wavier and 3 night inpatient policy. Patients wife is aware patient will be limited to SNF facilities due to patient not having a 3 night inpatient stay on the medical floor. An email was sent to [facility] to verify if patient qualifies for the wavier. CSW also spoke with patients wife and if she was interested in long term care. Patients wife stated no and she has the resources for a place for mom if she needs help with that process. Patient doesn't qualify for medicaid per patients wife.                   Barriers to Discharge: Continued Medical Work up   Patient Goals and CMS Choice Patient states their goals for this hospitalization and ongoing recovery are:: Possible rehab and to return home CMS Medicare.gov Compare Post Acute Care list provided to:: Patient Represenative (must comment) [name] (wife)) Choice offered to / list presented to : Spouse      Expected Discharge Plan and Services       Living arrangements for the past 2 months: Single Family Home                                      Prior Living Arrangements/Services Living arrangements for the past 2 months: Single Family Home Lives with:: Spouse Patient language and need for interpreter reviewed:: No        Need for Family Participation in Patient Care: Yes (Comment) Care giver support system in place?: Yes (comment) Current home services: DME Criminal Activity/Legal Involvement Pertinent to Current  Situation/Hospitalization: No - Comment as needed  Activities of Daily Living      Permission Sought/Granted                  Emotional Assessment Appearance:: Appears stated age Attitude/Demeanor/Rapport: Engaged Affect (typically observed): Calm Orientation: : Oriented to Self Alcohol / Substance Use: Not Applicable Psych Involvement: No (comment)  Admission diagnosis:  FOT Eliquis Patient Active Problem List   Diagnosis Date Noted   OSA on CPAP 03/15/2022   Changing skin lesion 02/25/2020   Paroxysmal atrial fibrillation (HCC) 12/31/2019   Elevated bilirubin 05/26/2019   Chronic anticoagulation    Tachycardia    Atrial fibrillation/flutter (HCC) 05/24/2019   Atrial flutter (HCC) 10/05/2017   PCP:  Lewis Moccasin, MD Pharmacy:   [phone] [place], [place] - [address] AT [address] OF [address] [address] [place] [address] Phone: [phone] Fax: [phone]  [facility] - [place], [place] - [address] [place] [address] Phone: [phone] Fax: [phone]     Social Determinants of Health (SDOH) Social History: SDOH Screenings   Food Insecurity: No Food Insecurity (12/07/2021)  Housing: Low Risk  (12/07/2021)  Transportation Needs: No Transportation Needs (12/07/2021)  Utilities: Not At Risk (12/07/2021)  Tobacco Use: High Risk (08/15/2022)   SDOH  Interventions:     Readmission Risk Interventions     No data to display

## 2022-08-15 NOTE — ED Notes (Addendum)
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 Wife at [facility]. Repeat INR and lactic drawn and sent. Pt remains alert, NAD, calm, interactive, baseline.

## 2022-08-15 NOTE — Care Management (Addendum)
Transition of Care [facility]) - Emergency Department Mini Assessment   Patient Details  Name: Bradley Mcbride MRN: [id number] Date of Birth: [birth year]  Transition of Care [facility]) CM/SW Contact:    Bradley Atlas, RN Phone Number: 08/15/2022, 12:02 PM   Clinical Narrative: Bradley Mcbride consult for increasing home health services. This RNCM spoke with patient's wife Bradley Mcbride who reports patient has ALZ with declining motor skills, right sided weakness. Bradley Mcbride reports patient currently has all needed DME and reports patient is under [facility]. Bradley Mcbride reports home hospice comes 3 days per week for showers. Bradley Mcbride reports original plan was to keep patient at home however depending on PT recommendation she will be open to a SNF. This RNCM advised that LTC facilities are normally placed within the community not within the ED setting. PT has been consulted, awaiting PT recommendation.  TOC will continue to follow.     ED Mini Assessment: What brought you to the Emergency Department? : fell out of bed  Barriers to Discharge: Continued Medical Work up  [employer] interventions: [facility] consult     Interventions which prevented an admission or readmission: Home Health Consult or Services    Patient Contact and Communications        ,          Patient states their goals for this hospitalization and ongoing recovery are:: to remain safe CMS Medicare.gov Compare Post Acute Care list provided to:: Patient Represenative (must comment) Bradley Mcbride (wife)) Choice offered to / list presented to : Spouse  Admission diagnosis:  FOT Eliquis Patient Active Problem List   Diagnosis Date Noted   OSA on CPAP 03/15/2022   Changing skin lesion 02/25/2020   Paroxysmal atrial fibrillation (HCC) 12/31/2019   Elevated bilirubin 05/26/2019   Chronic anticoagulation    Tachycardia    Atrial fibrillation/flutter (HCC) 05/24/2019   Atrial flutter (HCC) 10/05/2017   PCP:  Bradley Moccasin,  MD Pharmacy:   [phone] [place], [place] - [address] AT [address] OF [facility] [address]  [place] [address] Phone: [phone] Fax: [phone]  [facility] - [place], [place] - [address] [place] [address] Phone: [phone] Fax: [phone]

## 2022-08-15 NOTE — ED Notes (Addendum)
Notified CN that pt is max assist and requires feeding. Pt takes meds whole/crushed in applesauce. Pt sundowns pretty bad per wife. Takes his meds at 0830 and 1900. Bedtime is 1900. Adjusted med times to keep pt's med routine. Haldol given nightly for sundowning per wife. R sided weakness and aphasia from Alz per wife. Rapid decline in pt health w/ eating and ambulating per wife.

## 2022-08-15 NOTE — ED Notes (Signed)
Help get patient cleaned up and placed a brief family is at bedside and call bell in reach got patient some warm blankets

## 2022-08-15 NOTE — ED Notes (Signed)
DNR bracelet applied to R wrist w/ pt's wife at bedside

## 2022-08-15 NOTE — ED Notes (Signed)
Staff fed pt. Pt ate very little.

## 2022-08-15 NOTE — ED Provider Notes (Signed)
She had resident visit.  Patient rolled out of his bed at home.  He has a history of dementia.  History of A-fib on Eliquis.  Family saw him last night.  Wife lives with him but he sleeps in a different bed.  She says that he has aide several times a week but seems to be outgrowing her ability to take care of him on her own.  He does not have any pain.  He appears to be at his baseline.  EKG shows atrial fibrillation with heart rate in 100s.  Has not taken his morning medications.  Blood pressure is unremarkable.  Vital signs otherwise unremarkable.  He has no extremity pain.  Medical screening labs are ordered and overall unremarkable.  He has a mild leukocytosis of 14.  Lactic acid was 3 but overall nonspecific.  He has no fever.  He has no pain.  Fluid bolus given, p.o. diltiazem given.  He had trauma images done including CT of his head and neck as well as chest x-ray and pelvic x-ray that are unremarkable.  Overall family does not feel quite safe at home with him anymore.  Will have PT evaluate him and have placed home health orders and case management and social work consult as well.  May need to place into facility.  This chart was dictated using voice recognition software.  Despite best efforts to proofread,  errors can occur which can change the documentation meaning.    Virgina Norfolk, DO 08/15/22 1359

## 2022-08-16 DIAGNOSIS — S00211A Abrasion of right eyelid and periocular area, initial encounter: Secondary | ICD-10-CM | POA: Diagnosis not present

## 2022-08-16 NOTE — ED Provider Notes (Addendum)
Emergency Medicine Observation Re-evaluation Note  Bradley Mcbride is a 72 y.o. male, seen on rounds today.  Pt initially presented to the ED for complaints of Trauma and Fall Currently, the patient is in bed, getting changed.  Physical Exam  BP 108/72   Pulse 85   Temp 97.8 F (36.6 C) (Oral)   Resp 19   SpO2 100%  Physical Exam General: Awake, nondistressed Cardiac: Extremities well-perfused Lungs: Breathing is unlabored Psych: No current agitation  ED Course / MDM  EKG:EKG Interpretation  Date/Time:  Monday August 15 2022 09:55:20 EDT Ventricular Rate:  119 PR Interval:    QRS Duration: 103 QT Interval:  367 QTC Calculation: 488 R Axis:   103 Text Interpretation: Atrial fibrillation Consider right ventricular hypertrophy Confirmed by Virgina Norfolk ([id number]) on 08/15/2022 9:57:15 AM  I have reviewed the labs performed to date as well as medications administered while in observation.  Recent changes in the last 24 hours include presentation to the emergency department yesterday after a fall.  He has been medically cleared.  Family does not feel that they can provide a safe home for him.  He was evaluated by physical therapy.  They recommend inpatient PT services due to his physical decline.  Social work is working on placement.  Following discussions with social work and patient's wife, patient's wife requests to take him home.  Plan will be for discharge.  Plan  Current plan is for discharge.    Gloris Manchester, MD 08/16/22 0981    Gloris Manchester, MD 08/16/22 [phone]

## 2022-08-16 NOTE — Evaluation (Signed)
Occupational Therapy Evaluation Patient Details Name: Bradley Mcbride MRN: [id number] DOB: [birth year] Today's Date: 08/16/2022   History of Present Illness 72 y.o. male presents to [facility] hospital on 08/15/2022 after rolling out of bed into the floor. PMH includes OSA, afib, squamous cell carcinoma, aphasia, dementia.   Clinical Impression   PTA, pt lived with wife who assisted with ADL, IADL, and provided min guard A for transfers. On eval, pt presenting with generalized weakness, poor motor planning, decreased balance, safety, and awareness as compared to his baseline. Pt performing ADL with mod-max A, and functional transfers with mod A +2. Pt wife would like to take pt home due to importance of natural setting given dementia diagnosis. Recommending HHOT/PT/RN/NT to decrease caregiver burden. Will follow acutely.   Recommending hospital bed as well to optimize ability of family to care for pt at home as well as decr falls as pt falling from bed PTA.      Recommendations for follow up therapy are one component of a multi-disciplinary discharge planning process, led by the attending physician.  Recommendations may be updated based on patient status, additional functional criteria and insurance authorization.   Assistance Recommended at Discharge Frequent or constant Supervision/Assistance  Patient can return home with the following A lot of help with walking and/or transfers;Two people to help with walking and/or transfers;A lot of help with bathing/dressing/bathroom;Assistance with cooking/housework;Assist for transportation;Help with stairs or ramp for entrance;Direct supervision/assist for financial management;Direct supervision/assist for medications management;Assistance with feeding    Functional Status Assessment  Patient has had a recent decline in their functional status and demonstrates the ability to make significant improvements in function in a reasonable and predictable amount of  time.  Equipment Recommendations  Hospital bed    Recommendations for Other Services Other (comment)     Precautions / Restrictions Precautions Precautions: Fall Restrictions Weight Bearing Restrictions: No      Mobility Bed Mobility Overal bed mobility: Needs Assistance Bed Mobility: Sit to Supine       Sit to supine: Max assist   General bed mobility comments: multimodal cueing    Transfers Overall transfer level: Needs assistance Equipment used: 2 person hand held assist Transfers: Sit to/from Stand Sit to Stand: Min assist           General transfer comment: from transport chair      Balance Overall balance assessment: Needs assistance Sitting-balance support: Single extremity supported, Feet supported Sitting balance-Leahy Scale: Fair Sitting balance - Comments: Supervision EOB statically Postural control: Right lateral lean Standing balance support: Single extremity supported Standing balance-Leahy Scale: Poor Standing balance comment: Min A statically, Mod A with steps                           ADL either performed or assessed with clinical judgement   ADL Overall ADL's : Needs assistance/impaired Eating/Feeding: Moderate assistance   Grooming: Moderate assistance   Upper Body Bathing: Maximal assistance   Lower Body Bathing: Total assistance   Upper Body Dressing : Maximal assistance Upper Body Dressing Details (indicate cue type and reason): assist and cues for all portions Lower Body Dressing: Total assistance Lower Body Dressing Details (indicate cue type and reason): threading and pulling up pants assist Toilet Transfer: Moderate assistance;+2 for physical assistance;Stand-pivot Toilet Transfer Details (indicate cue type and reason): SPT         Functional mobility during ADLs: Moderate assistance;+2 for safety/equipment       Vision  Baseline Vision/History: 1 Wears glasses Patient Visual Report: No change from  baseline Vision Assessment?: Vision impaired- to be further tested in functional context Additional Comments: suspect decr peripheral vision     Perception Perception Perception Tested?: No   Praxis Praxis Praxis tested?: Not tested    Pertinent Vitals/Pain Pain Assessment Pain Assessment: PAINAD     Hand Dominance Right   Extremity/Trunk Assessment Upper Extremity Assessment Upper Extremity Assessment: Generalized weakness;RUE deficits/detail RUE Deficits / Details: spouse reports R weakness at baseline. Also bony prominence at [facility] joint, but DG reveals age related changes   Lower Extremity Assessment Lower Extremity Assessment: Defer to PT evaluation       Communication Communication Communication: Expressive difficulties;Receptive difficulties (chronic expressive aphasia)   Cognition Arousal/Alertness: Awake/alert Behavior During Therapy: Flat affect Overall Cognitive Status: History of cognitive impairments - at baseline                                 General Comments: pt is oriented to self, identifies his spouse. Pt with significant expressive aphasia. Baseline dementia.     General Comments  VSS    Exercises     Shoulder Instructions      Home Living Family/patient expects to be discharged to:: Private residence Living Arrangements: Spouse/significant other Available Help at Discharge: Family;Personal care attendant;Available 24 hours/day (aide 3 days a week for 4 hours each day) Type of Home: House Home Access: Stairs to enter [employer] of Steps: 2 Entrance Stairs-Rails: None Home Layout: Able to live on main level with bedroom/bathroom     Bathroom Shower/Tub: Walk-in shower         Home Equipment: Agricultural consultant (2 wheels);Rollator (4 wheels);BSC/3in1;Shower seat;Wheelchair - manual          Prior Functioning/Environment Prior Level of Function : Needs assist             Mobility Comments: PRN assist for  transfers, ambulates with minG. Typically able to ambulate in neighborhood with spouse until decline this last week ADLs Comments: assistance for ADLs and all IADLs        OT Problem List: Decreased strength;Decreased activity tolerance;Impaired balance (sitting and/or standing);Decreased safety awareness;Decreased cognition;Decreased knowledge of use of DME or AE      OT Treatment/Interventions: Self-care/ADL training;Therapeutic exercise;DME and/or AE instruction;Therapeutic activities;Patient/family education;Balance training    OT Goals(Current goals can be found in the care plan section) Acute Rehab OT Goals Patient Stated Goal: unable; per wife go to natural environment OT Goal Formulation: With patient/family Time For Goal Achievement: 08/30/22 Potential to Achieve Goals: Good  OT Frequency: Min 2X/week    Co-evaluation              AM-PAC OT "6 Clicks" Daily Activity     Outcome Measure Help from another person eating meals?: A Lot Help from another person taking care of personal grooming?: A Lot Help from another person toileting, which includes using toliet, bedpan, or urinal?: A Lot Help from another person bathing (including washing, rinsing, drying)?: A Lot Help from another person to put on and taking off regular upper body clothing?: A Lot Help from another person to put on and taking off regular lower body clothing?: Total 6 Click Score: 11   End of Session Equipment Utilized During Treatment: Gait belt Nurse Communication: Mobility status  Activity Tolerance: Patient tolerated treatment well Patient left: in bed;with call bell/phone within reach;with family/visitor present  OT  Visit Diagnosis: Unsteadiness on feet (R26.81);Muscle weakness (generalized) (M62.81);Other abnormalities of gait and mobility (R26.89);Other symptoms and signs involving cognitive function                Time: 4098-1191 OT Time Calculation (min): 26 min Charges:  OT General  Charges $OT Visit: 1 Visit OT Evaluation $OT Eval Moderate Complexity: 1 Mod OT Treatments $Self Care/Home Management : 8-22 mins  Tyler Deis, OTR/L [facility] Acute Rehabilitation Office: [phone]   Myrla Halsted 08/16/2022, 11:57 AM

## 2022-08-16 NOTE — Discharge Instructions (Signed)
Continue home medications as prescribed.  Talk to your primary care doctor about the risks and benefits of remaining on anticoagulation.  Return to the emergency department for any new or worsening symptoms of concern.

## 2022-08-16 NOTE — Progress Notes (Signed)
CSW spoke with patients wife, Bradley Mcbride at bedside. [name] stated she spoke with the family and they have decided that SNF wouldn't be in patients best interest at this time. Ms. Laws stated she would like to bring patient home today. Patients wife asked CSW if she could contact patient home health/hospice agency [facility]) to make them aware. Patients wife stated she would like to take patient home in her car rather then wait on PTAR Patients wife stated she will have help from patients [facility] nurse and neighbor to assist her in getting patient into the home.    CSW contacted [facility] and spoke with [name] [phone]). [name] stated they were aware of patient returning home and patients RN will assist the wife with getting him inside the home. CSW asked [name] if there was anything they needed from the hospital and she stated no. [name] asked if the wife could call them when she gets close to the house.

## 2022-08-23 ENCOUNTER — Telehealth: Payer: Self-pay

## 2022-08-23 NOTE — Telephone Encounter (Signed)
Transition Care Management Follow-up Telephone Call Date of discharge and from where: 08/16/2022 [facility] How have you been since you were released from the hospital? Per patient's spouse he is very weak, not able to walk, decreased appetite and sleeping more. Any questions or concerns? No  Items Reviewed: Did the pt receive and understand the discharge instructions provided? Yes  Medications obtained and verified? Yes  Other? No  Any new allergies since your discharge? No  Dietary orders reviewed? Yes Do you have support at home? Yes   Follow up appointments reviewed:  PCP Hospital f/u appt confirmed? Yes  Scheduled to see Maryelizabeth Rowan, MD on 08/24/2022 @ . Specialist Hospital f/u appt confirmed? No  Scheduled to see  on  @ . Are transportation arrangements needed? No  If their condition worsens, is the pt aware to call PCP or go to the Emergency Dept.? Yes Was the patient provided with contact information for the PCP's office or ED? Yes Was to pt encouraged to call back with questions or concerns? Yes  Leyanna Bittman [facility]  [facility] Population Health Community Resource Care Guide   [email or web address]  ?? [phone]   Website: [email or web address]  [email or web address]

## 2022-10-23 DEATH — deceased
